# Patient Record
Sex: Female | Born: 1935 | Race: White | Hispanic: No | Marital: Married | Smoking: Former smoker
Health system: Southern US, Community
[De-identification: ages and names within clinical notes are randomized; demographics above are authoritative.]

## PROBLEM LIST (undated history)

## (undated) DIAGNOSIS — E079 Disorder of thyroid, unspecified: Secondary | ICD-10-CM

## (undated) DIAGNOSIS — I10 Essential (primary) hypertension: Secondary | ICD-10-CM

## (undated) DIAGNOSIS — F419 Anxiety disorder, unspecified: Secondary | ICD-10-CM

## (undated) DIAGNOSIS — F329 Major depressive disorder, single episode, unspecified: Secondary | ICD-10-CM

## (undated) DIAGNOSIS — I5189 Other ill-defined heart diseases: Secondary | ICD-10-CM

## (undated) DIAGNOSIS — I701 Atherosclerosis of renal artery: Secondary | ICD-10-CM

## (undated) DIAGNOSIS — E785 Hyperlipidemia, unspecified: Secondary | ICD-10-CM

## (undated) DIAGNOSIS — J9610 Chronic respiratory failure, unspecified whether with hypoxia or hypercapnia: Secondary | ICD-10-CM

## (undated) DIAGNOSIS — I150 Renovascular hypertension: Secondary | ICD-10-CM

## (undated) DIAGNOSIS — J449 Chronic obstructive pulmonary disease, unspecified: Secondary | ICD-10-CM

## (undated) DIAGNOSIS — N289 Disorder of kidney and ureter, unspecified: Secondary | ICD-10-CM

## (undated) DIAGNOSIS — M1990 Unspecified osteoarthritis, unspecified site: Secondary | ICD-10-CM

## (undated) DIAGNOSIS — F32A Depression, unspecified: Secondary | ICD-10-CM

## (undated) HISTORY — PX: BREAST SURGERY: SHX581

## (undated) HISTORY — DX: Renovascular hypertension: I15.0

## (undated) HISTORY — PX: APPENDECTOMY: SHX54

## (undated) HISTORY — PX: EYE SURGERY: SHX253

## (undated) HISTORY — DX: Essential (primary) hypertension: I10

## (undated) HISTORY — DX: Chronic obstructive pulmonary disease, unspecified: J44.9

## (undated) HISTORY — DX: Atherosclerosis of renal artery: I70.1

## (undated) HISTORY — DX: Unspecified osteoarthritis, unspecified site: M19.90

## (undated) HISTORY — DX: Anxiety disorder, unspecified: F41.9

## (undated) HISTORY — DX: Disorder of thyroid, unspecified: E07.9

## (undated) HISTORY — DX: Major depressive disorder, single episode, unspecified: F32.9

## (undated) HISTORY — DX: Hyperlipidemia, unspecified: E78.5

## (undated) HISTORY — DX: Depression, unspecified: F32.A

---

## 2002-07-08 ENCOUNTER — Ambulatory Visit (HOSPITAL_COMMUNITY): Admission: RE | Admit: 2002-07-08 | Discharge: 2002-07-09 | Payer: Self-pay | Admitting: *Deleted

## 2004-10-27 ENCOUNTER — Ambulatory Visit: Payer: Self-pay | Admitting: Internal Medicine

## 2005-05-14 ENCOUNTER — Ambulatory Visit: Payer: Self-pay

## 2005-09-04 ENCOUNTER — Ambulatory Visit: Payer: Self-pay

## 2005-09-10 ENCOUNTER — Ambulatory Visit: Payer: Self-pay

## 2005-09-17 ENCOUNTER — Ambulatory Visit: Payer: Self-pay

## 2005-09-19 ENCOUNTER — Ambulatory Visit: Payer: Self-pay

## 2005-10-15 ENCOUNTER — Ambulatory Visit: Payer: Self-pay | Admitting: General Surgery

## 2006-06-11 ENCOUNTER — Ambulatory Visit: Payer: Self-pay | Admitting: Internal Medicine

## 2007-01-28 ENCOUNTER — Ambulatory Visit: Payer: Self-pay | Admitting: Internal Medicine

## 2007-06-17 ENCOUNTER — Ambulatory Visit: Payer: Self-pay | Admitting: Internal Medicine

## 2007-08-22 ENCOUNTER — Ambulatory Visit: Payer: Self-pay | Admitting: Internal Medicine

## 2008-04-15 ENCOUNTER — Ambulatory Visit: Payer: Self-pay | Admitting: Internal Medicine

## 2008-04-27 ENCOUNTER — Other Ambulatory Visit: Payer: Self-pay

## 2008-04-28 ENCOUNTER — Inpatient Hospital Stay: Payer: Self-pay | Admitting: Vascular Surgery

## 2008-05-10 ENCOUNTER — Ambulatory Visit: Payer: Self-pay | Admitting: Internal Medicine

## 2008-06-01 ENCOUNTER — Ambulatory Visit: Payer: Self-pay | Admitting: Internal Medicine

## 2008-06-09 ENCOUNTER — Ambulatory Visit: Payer: Self-pay | Admitting: Gastroenterology

## 2008-06-10 ENCOUNTER — Ambulatory Visit: Payer: Self-pay | Admitting: Gastroenterology

## 2008-06-16 ENCOUNTER — Ambulatory Visit: Payer: Self-pay | Admitting: Internal Medicine

## 2008-07-07 ENCOUNTER — Ambulatory Visit: Payer: Self-pay | Admitting: Internal Medicine

## 2008-08-12 ENCOUNTER — Ambulatory Visit: Payer: Self-pay | Admitting: Internal Medicine

## 2008-11-18 ENCOUNTER — Emergency Department: Payer: Self-pay | Admitting: Emergency Medicine

## 2008-11-30 ENCOUNTER — Ambulatory Visit: Payer: Self-pay | Admitting: Internal Medicine

## 2008-12-21 ENCOUNTER — Ambulatory Visit: Payer: Self-pay | Admitting: Internal Medicine

## 2009-01-29 ENCOUNTER — Ambulatory Visit: Payer: Self-pay | Admitting: Internal Medicine

## 2009-01-30 ENCOUNTER — Emergency Department: Payer: Self-pay | Admitting: Emergency Medicine

## 2009-02-12 ENCOUNTER — Emergency Department: Payer: Self-pay | Admitting: Unknown Physician Specialty

## 2009-02-18 ENCOUNTER — Ambulatory Visit: Payer: Self-pay | Admitting: Specialist

## 2009-02-23 ENCOUNTER — Ambulatory Visit: Payer: Self-pay | Admitting: Urology

## 2009-02-24 ENCOUNTER — Ambulatory Visit: Payer: Self-pay | Admitting: Urology

## 2009-05-03 ENCOUNTER — Ambulatory Visit: Payer: Self-pay | Admitting: Urology

## 2009-05-05 ENCOUNTER — Ambulatory Visit: Payer: Self-pay | Admitting: Urology

## 2009-07-14 ENCOUNTER — Ambulatory Visit: Payer: Self-pay | Admitting: Internal Medicine

## 2009-12-15 ENCOUNTER — Ambulatory Visit: Payer: Self-pay | Admitting: Internal Medicine

## 2010-05-30 IMAGING — US US RENAL KIDNEY
1 series · 17 of 25 positions shown · non-contrast
Comparison: none

REASON FOR EXAM: pain
COMMENTS:

PROCEDURE:     US  - US KIDNEY  - February 12, 2009  [DATE]
RESULT:     Comparison: None
INDICATION: Pain

[Series 1: us renal kidney · 17 of 38 slices shown]
[im 1/38]
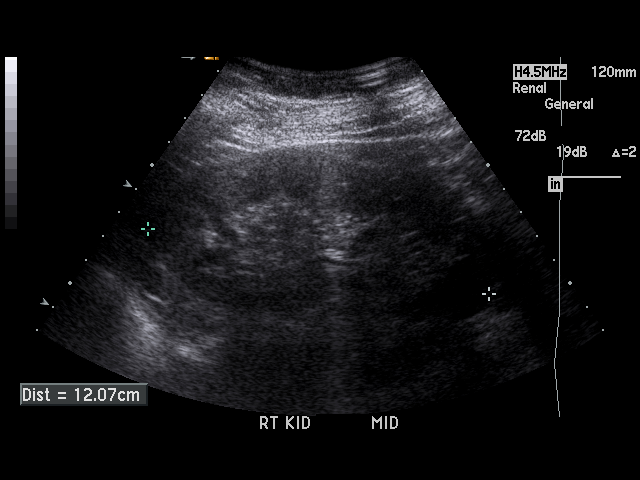
[im 4/38]
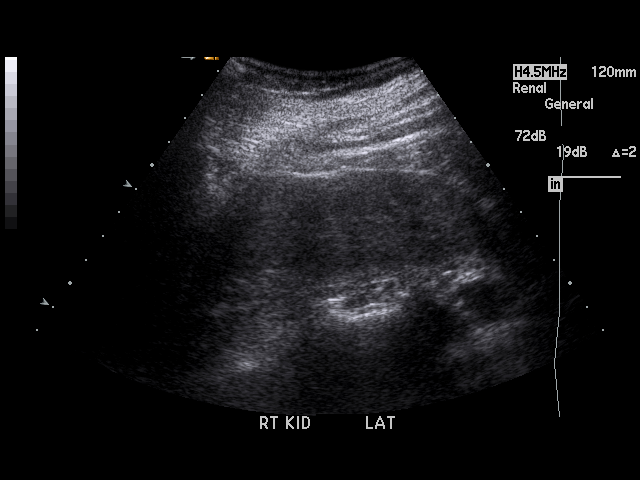
[im 5/38]
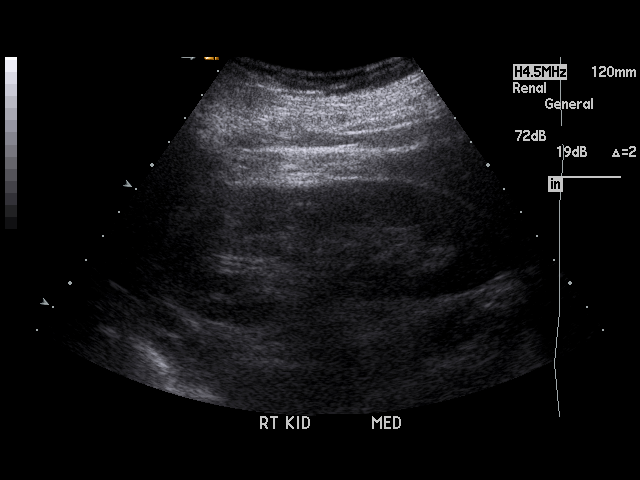
[im 8/38]
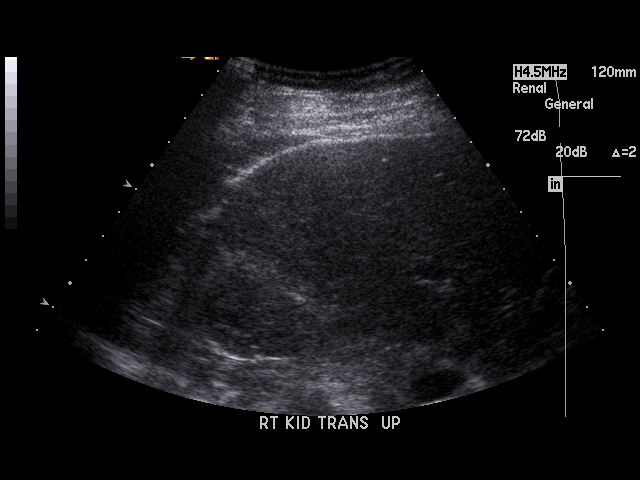
[im 10/38]
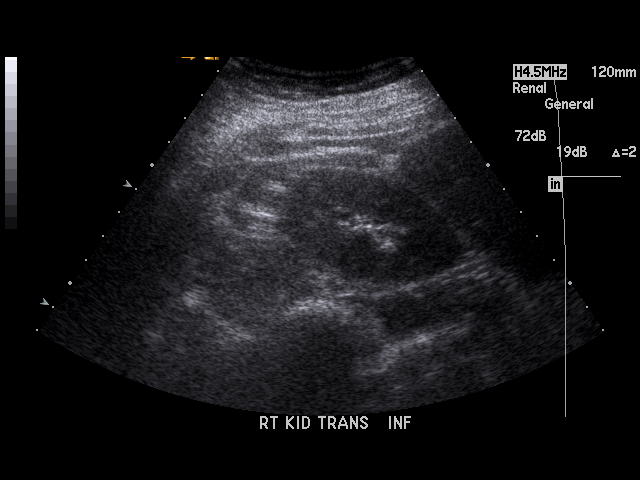
[im 13/38]
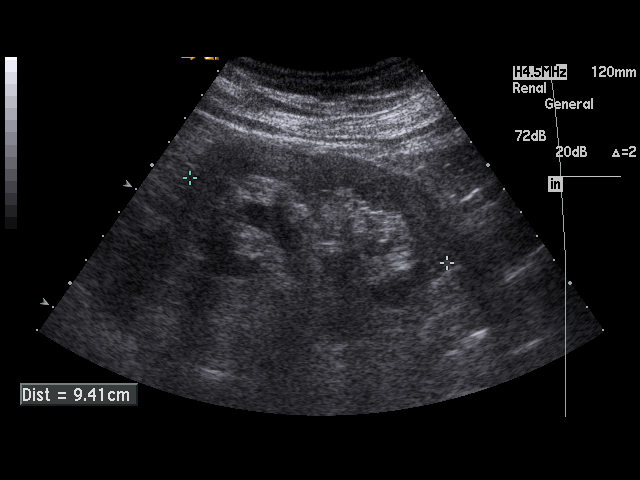
[im 14/38]
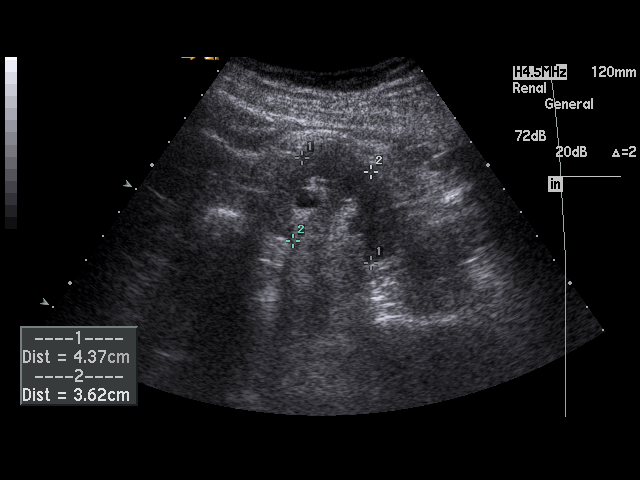
[im 17/38]
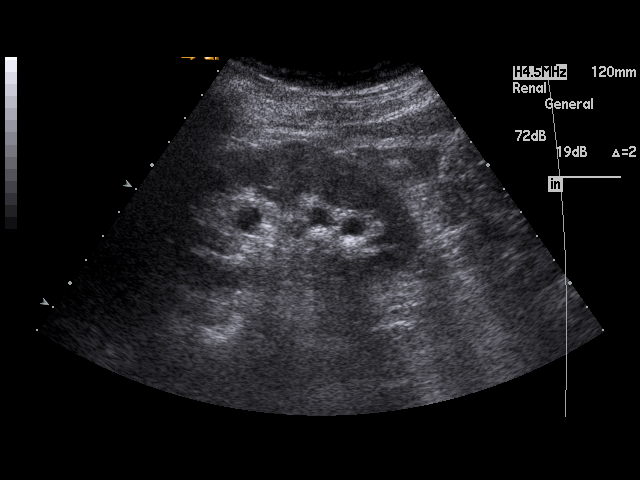
[im 19/38]
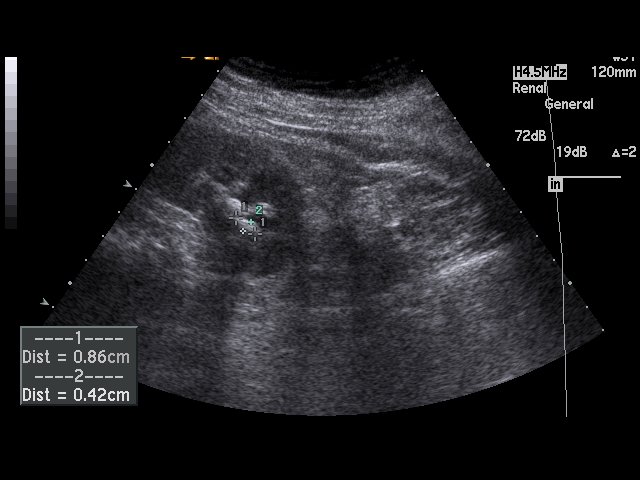
[im 21/38]
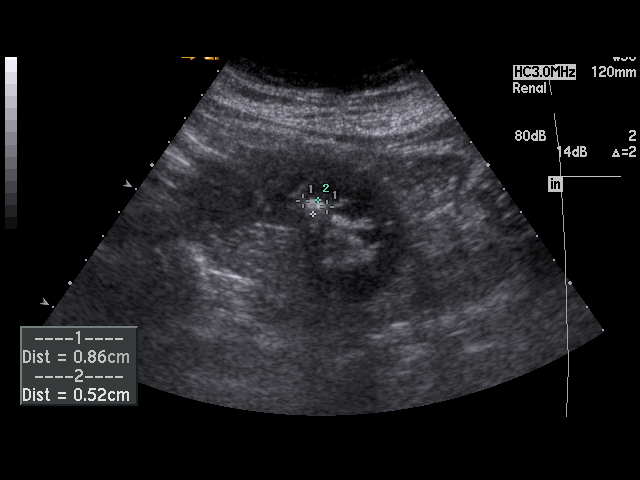
[im 24/38]
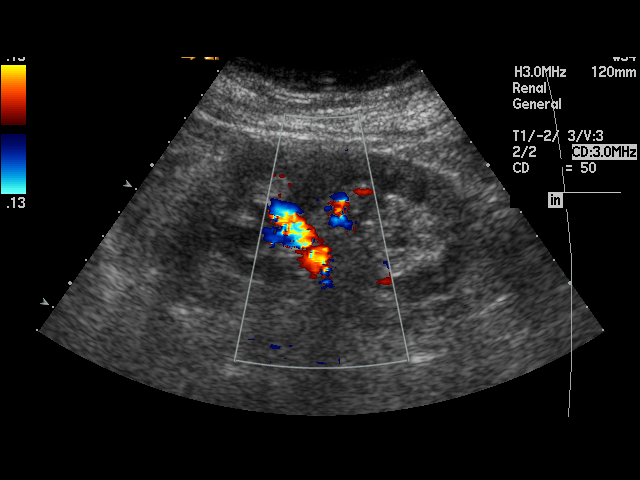
[im 25/38]
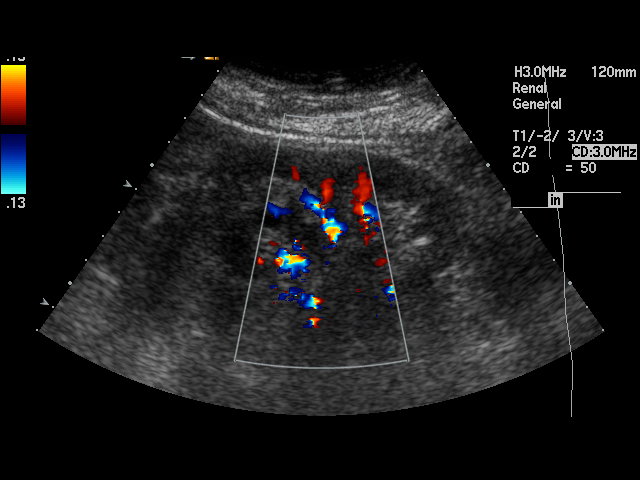
[im 28/38]
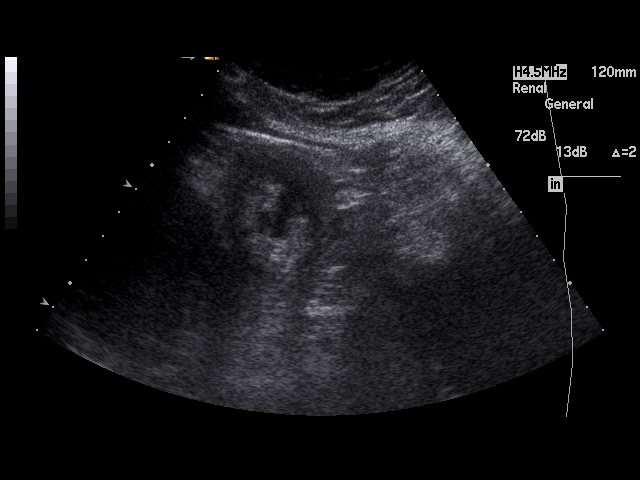
[im 30/38]
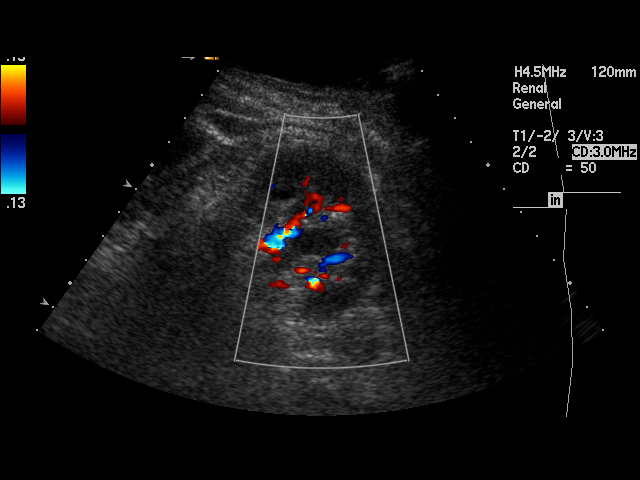
[im 33/38]
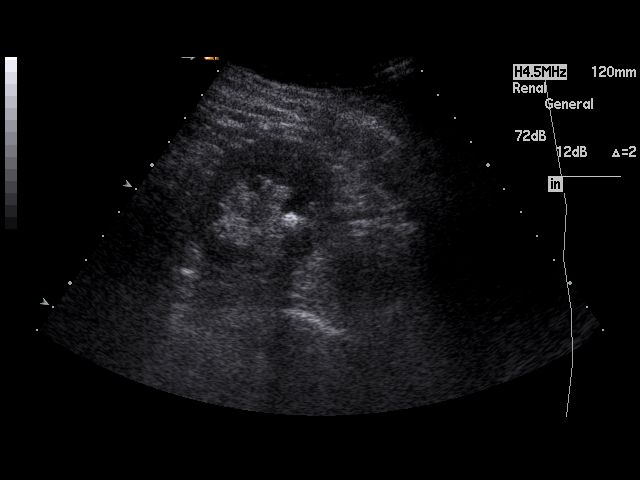
[im 34/38]
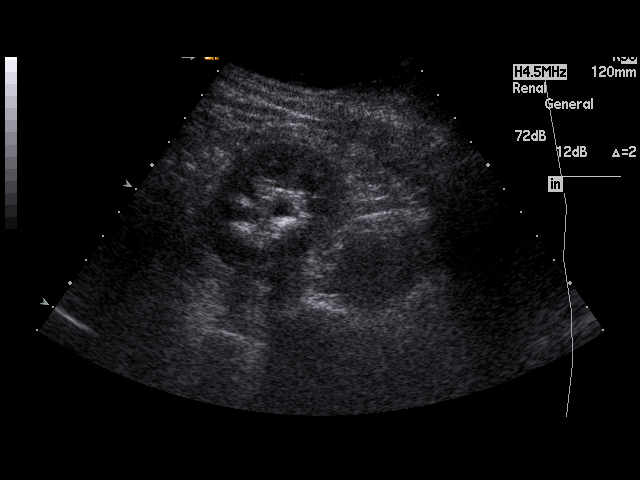
[im 38/38]
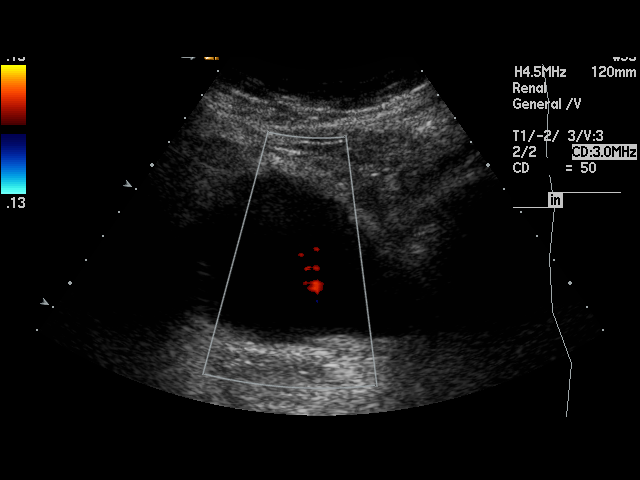

[17 of 25 positions shown; findings below may reference images not displayed]

Technique and findings: Multiple gray-scale and color doppler images of the
kidneys were obtained.

The right kidney measures 12.1 cm, the left kidney measures 9.5 cm. The
kidneys are normal in echogenicity. There is no hydronephrosis or evident
renal masses. There are 2 echogenic foci with posterior shadowing in the
left kidney most consistent with renal calculi. The echogenic focus in the
lower pole measures 1.9 cm in greatest dimension. The echogenic focus in the
midpole of the left kidney measures 9 mm in greatest dimension. No free
fluid in the region of the renal fossa.
IMPRESSION: Two nonobstructing left renal calculi again noted. There is no obstructive
uropathy.

## 2010-05-30 IMAGING — CR DG CHEST 2V
1 series · 2 of 2 positions shown · non-contrast
Comparison: none

REASON FOR EXAM: left flank pain
COMMENTS:

PROCEDURE:     DXR - DXR CHEST PA (OR AP) AND LATERAL  - February 12, 2009  [DATE]
RESULT:     Comparison: 12/21/2008

[Series 1: view not recorded · 0.17mm/px · 2 of 2 slices shown]
[im 1/2]
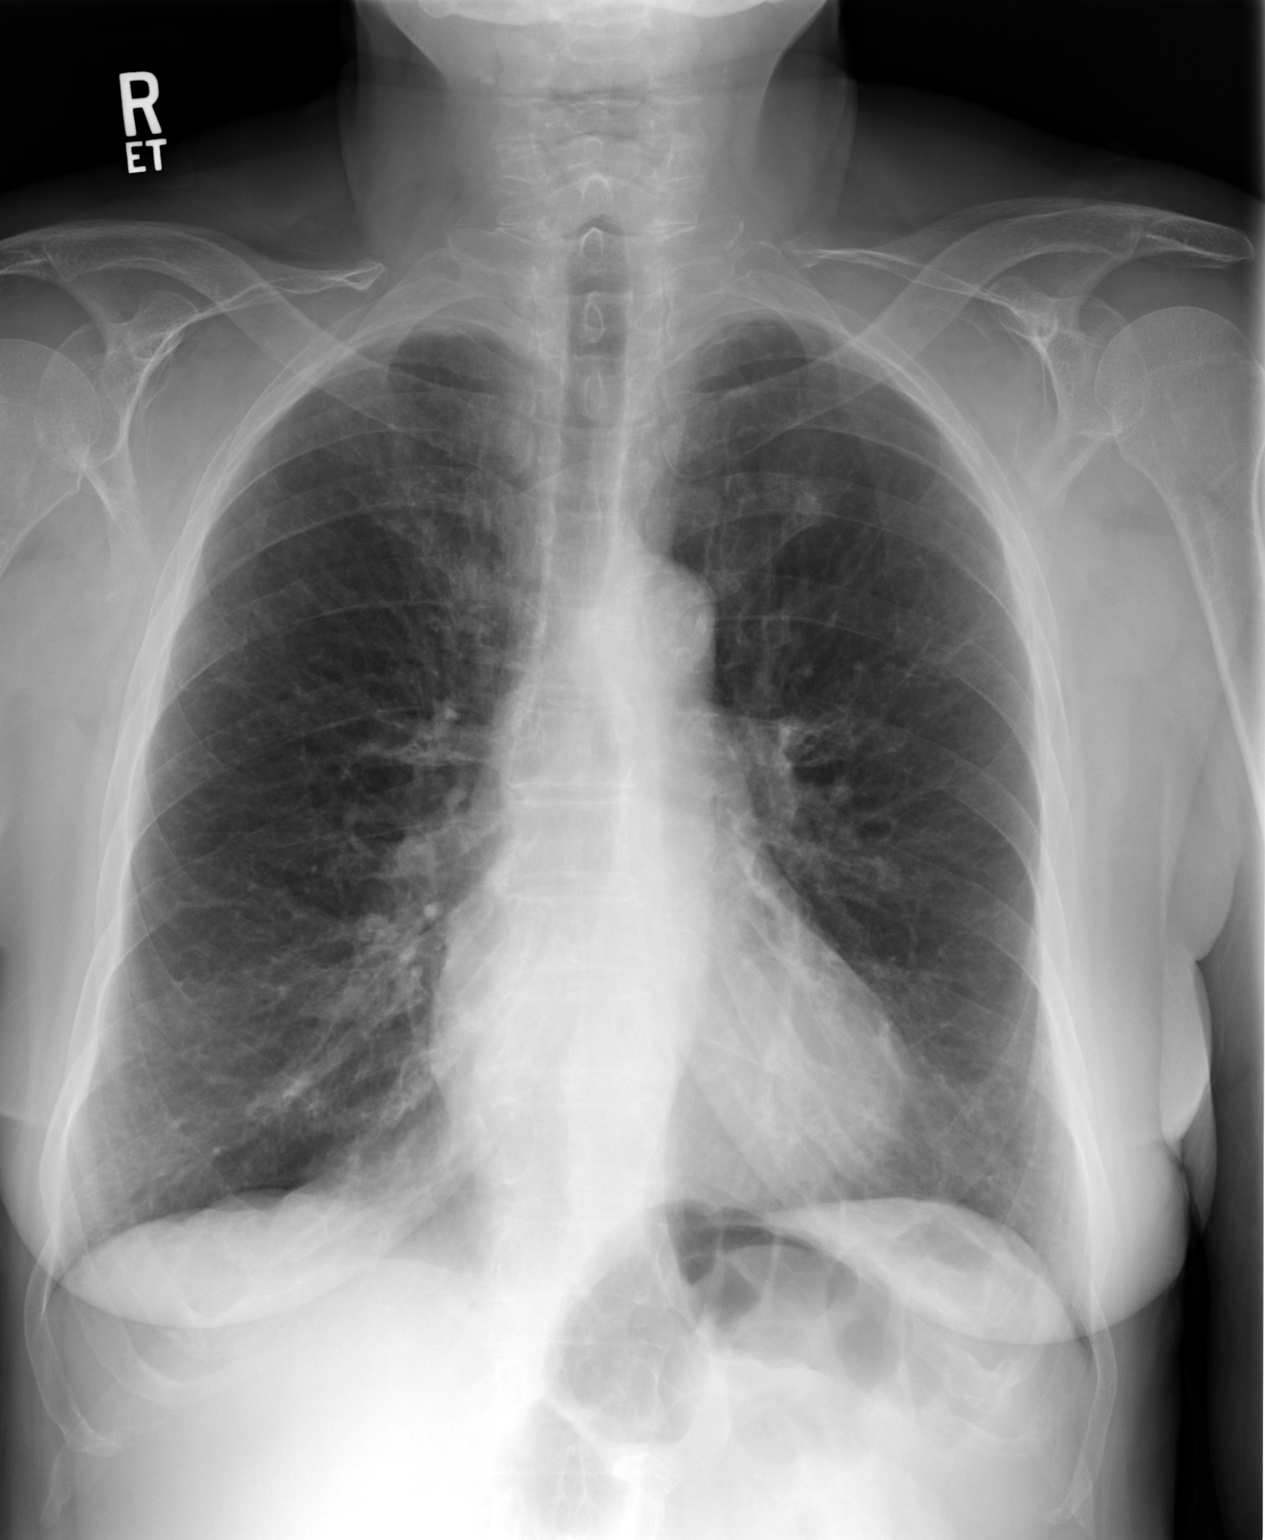
[im 2/2]
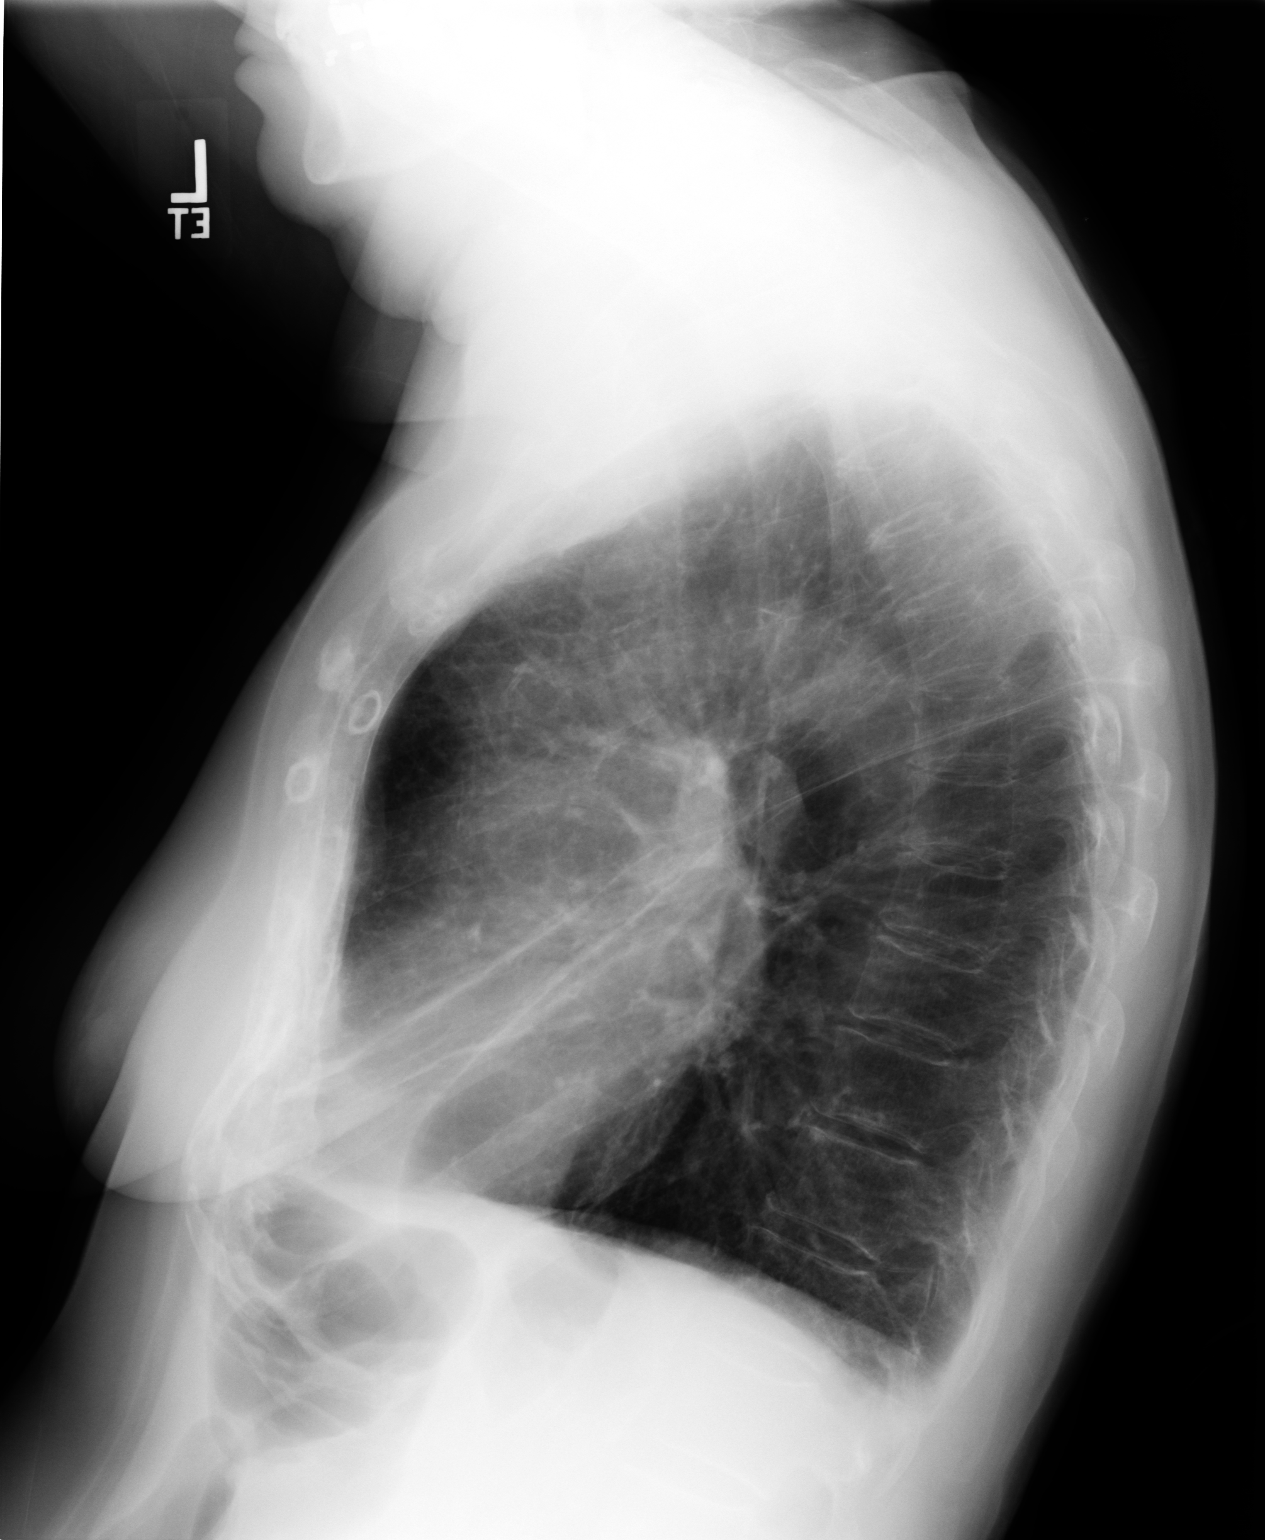

[2 of 2 positions shown; findings below may reference images not displayed]

FINDINGS: PA and lateral chest radiographs are provided. There is no focal parenchymal
opacity, pleural effusion, or pneumothorax. The lungs are hyperinflated with
relative lucency of the upper lung fields most consistent with COPD. The
heart and mediastinum are unremarkable. The osseous structures are
unremarkable.
IMPRESSION: No acute disease of the chest.

## 2010-06-11 IMAGING — CR DG ABDOMEN 1V
1 series · 2 of 2 positions shown · non-contrast
Comparison: none

REASON FOR EXAM: renal calculi-lithotripsy
COMMENTS:

[Series 1: view not recorded · 0.17mm/px · 2 of 2 slices shown]
[im 1/2]
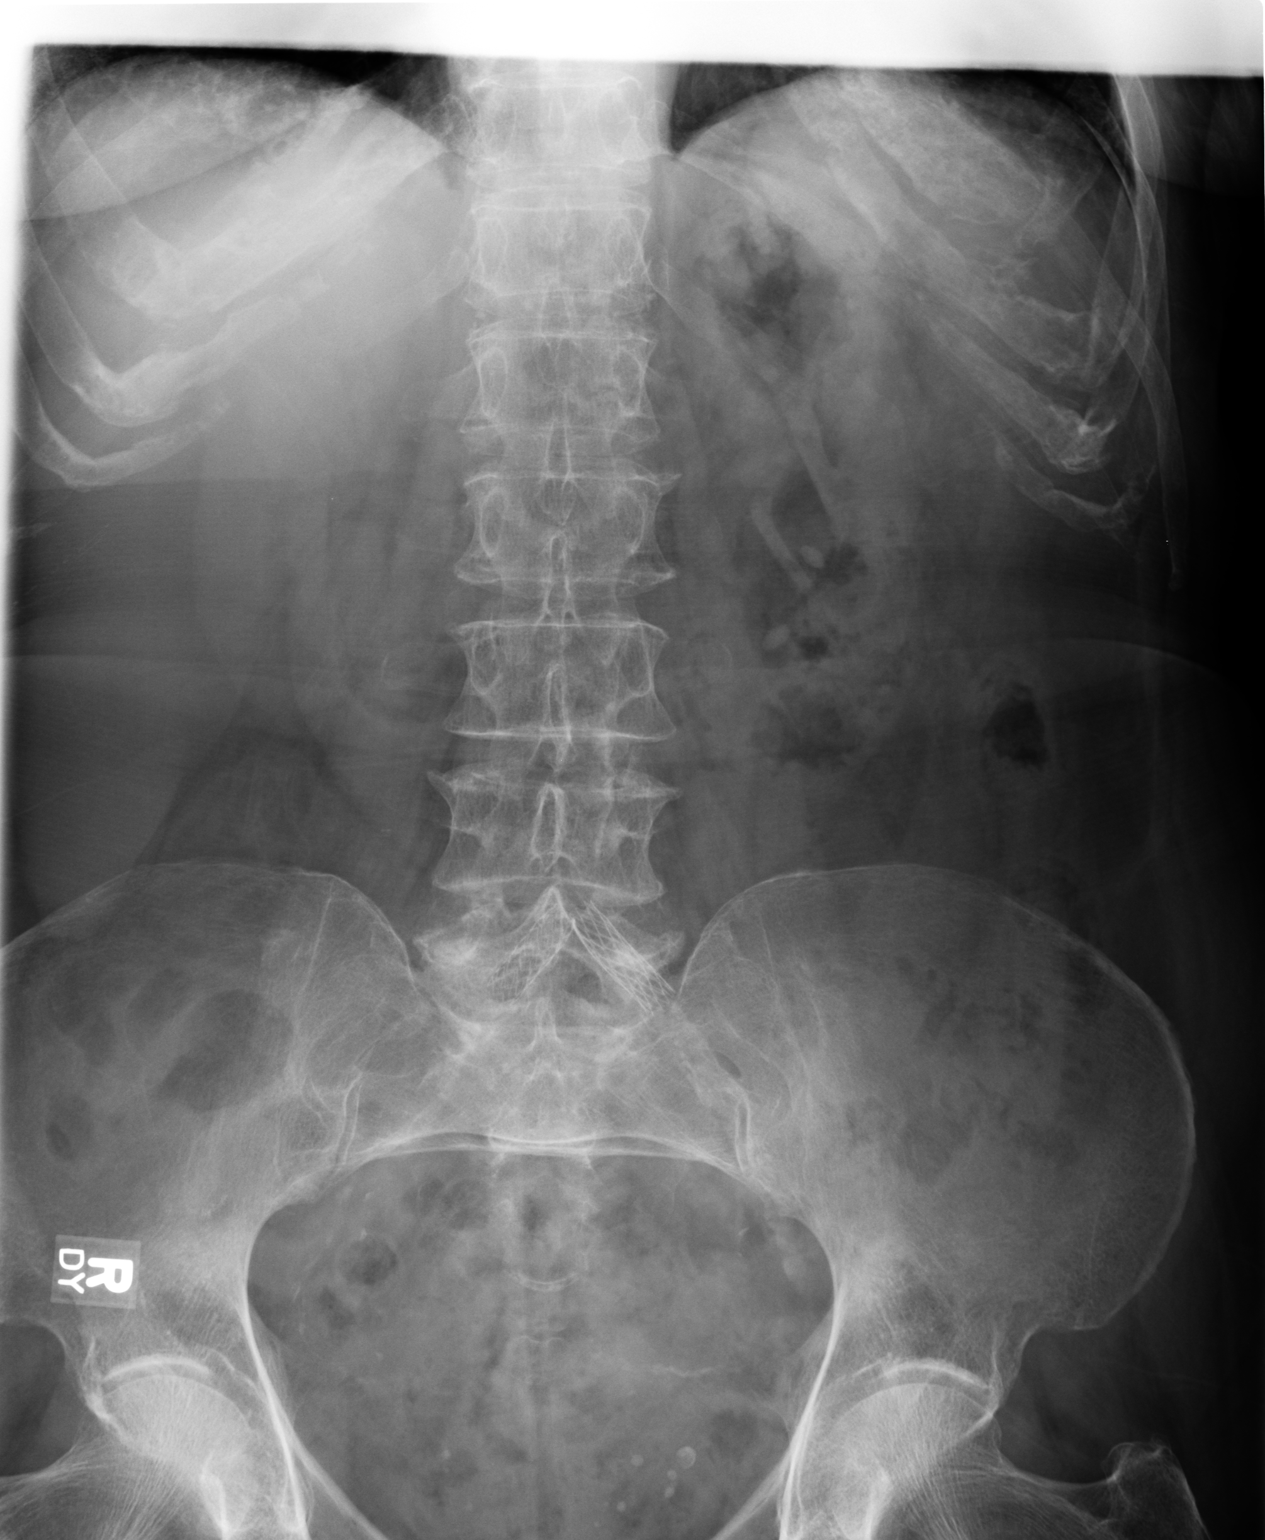
[im 2/2]
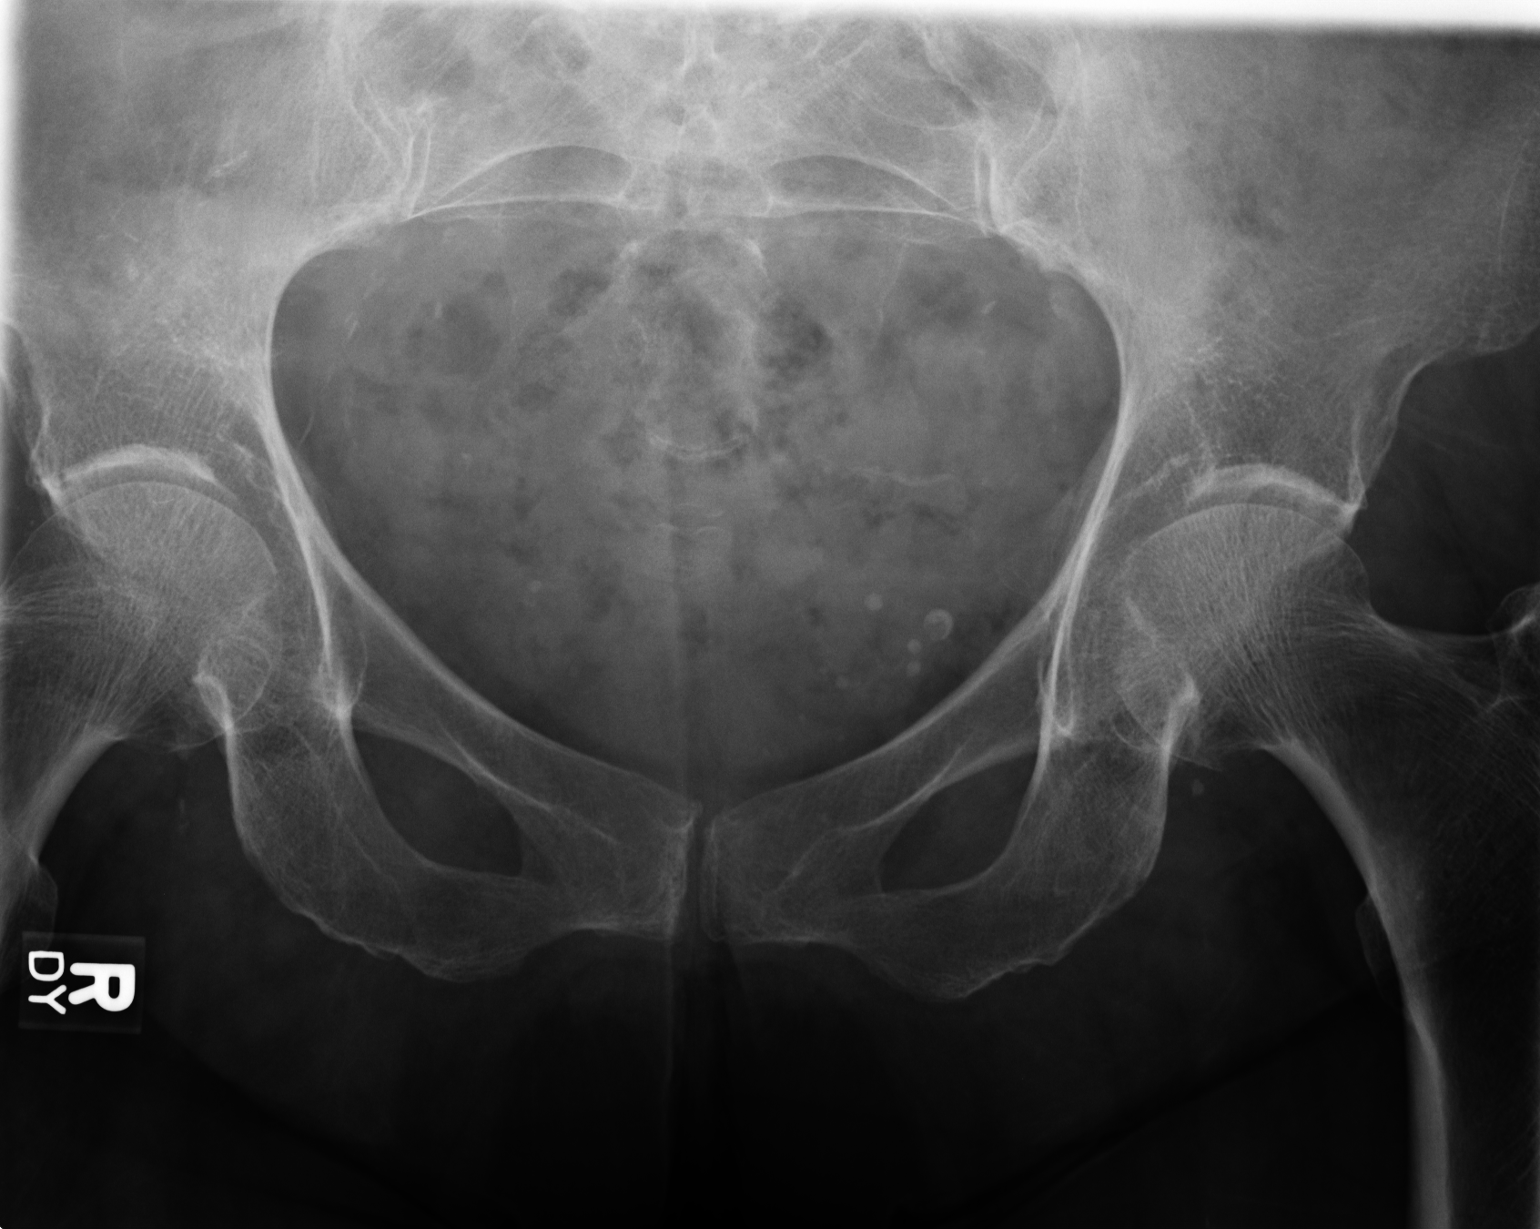

[2 of 2 positions shown; findings below may reference images not displayed]

PROCEDURE:     DXR - DXR KIDNEY URETER BLADDER  - February 24, 2009  [DATE]

RESULT:     There is no previous plain film for comparison. The patient has
undergone recent CT. There are smoothly marginated calcific densities
projecting over the region of the left renal pelvis and mid pole region. No
definite right renal calculi are seen. Multiple phleboliths appear to be
present in the pelvis. The bowel gas pattern is unremarkable.
IMPRESSION: Left nephrolithiasis.

## 2010-08-15 ENCOUNTER — Inpatient Hospital Stay: Payer: Self-pay | Admitting: Internal Medicine

## 2010-09-19 ENCOUNTER — Ambulatory Visit: Payer: Self-pay | Admitting: Internal Medicine

## 2011-03-15 ENCOUNTER — Ambulatory Visit: Payer: Self-pay | Admitting: Internal Medicine

## 2011-11-15 ENCOUNTER — Ambulatory Visit: Payer: Self-pay | Admitting: Internal Medicine

## 2012-02-11 ENCOUNTER — Ambulatory Visit: Payer: Self-pay | Admitting: Internal Medicine

## 2012-02-11 LAB — CREATININE, SERUM
Creatinine: 1.18 mg/dL (ref 0.60–1.30)
EGFR (African American): 57 — ABNORMAL LOW
EGFR (Non-African Amer.): 47 — ABNORMAL LOW

## 2012-03-03 ENCOUNTER — Inpatient Hospital Stay: Payer: Self-pay | Admitting: Specialist

## 2012-03-03 LAB — URINALYSIS, COMPLETE
Bilirubin,UR: NEGATIVE
Glucose,UR: 50 mg/dL (ref 0–75)
Hyaline Cast: 4
Ketone: NEGATIVE
Leukocyte Esterase: NEGATIVE
Ph: 7 (ref 4.5–8.0)
Protein: NEGATIVE
RBC,UR: 1 /HPF (ref 0–5)
WBC UR: <1 /HPF (ref 0–5)

## 2012-03-03 LAB — CK TOTAL AND CKMB (NOT AT ARMC)
CK, Total: 27 U/L (ref 21–215)
CK, Total: 37 U/L (ref 21–215)
CK-MB: 1.2 ng/mL (ref 0.5–3.6)

## 2012-03-03 LAB — CBC WITH DIFFERENTIAL/PLATELET
Basophil %: 0 %
Eosinophil #: 0 10*3/uL (ref 0.0–0.7)
Eosinophil %: 0.2 %
HCT: 39.6 % (ref 35.0–47.0)
Lymphocyte #: 0.3 10*3/uL — ABNORMAL LOW (ref 1.0–3.6)
Lymphocyte %: 2.9 %
MCH: 30.6 pg (ref 26.0–34.0)
MCHC: 33 g/dL (ref 32.0–36.0)
MCV: 93 fL (ref 80–100)
Monocyte %: 0.5 %
Neutrophil #: 9.9 10*3/uL — ABNORMAL HIGH (ref 1.4–6.5)
RBC: 4.28 10*6/uL (ref 3.80–5.20)
RDW: 16.1 % — ABNORMAL HIGH (ref 11.5–14.5)

## 2012-03-03 LAB — COMPREHENSIVE METABOLIC PANEL
Albumin: 2.9 g/dL — ABNORMAL LOW (ref 3.4–5.0)
Bilirubin,Total: 0.3 mg/dL (ref 0.2–1.0)
Calcium, Total: 8.9 mg/dL (ref 8.5–10.1)
Chloride: 96 mmol/L — ABNORMAL LOW (ref 98–107)
Creatinine: 1.62 mg/dL — ABNORMAL HIGH (ref 0.60–1.30)
EGFR (African American): 39 — ABNORMAL LOW
EGFR (Non-African Amer.): 32 — ABNORMAL LOW
Glucose: 346 mg/dL — ABNORMAL HIGH (ref 65–99)
SGPT (ALT): 29 U/L

## 2012-03-03 LAB — PRO B NATRIURETIC PEPTIDE: B-Type Natriuretic Peptide: 479 pg/mL — ABNORMAL HIGH (ref 0–450)

## 2012-03-03 LAB — MAGNESIUM: Magnesium: 1.6 mg/dL — ABNORMAL LOW

## 2012-03-03 LAB — TROPONIN I
Troponin-I: 0.03 ng/mL
Troponin-I: 0.02 ng/mL

## 2012-03-04 LAB — TROPONIN I: Troponin-I: 0.02 ng/mL

## 2012-03-04 LAB — MAGNESIUM: Magnesium: 1.8 mg/dL

## 2012-03-04 LAB — CBC WITH DIFFERENTIAL/PLATELET
Basophil #: 0 10*3/uL (ref 0.0–0.1)
Lymphocyte #: 0.4 10*3/uL — ABNORMAL LOW (ref 1.0–3.6)
Lymphocyte %: 4.6 %
MCHC: 33.9 g/dL (ref 32.0–36.0)
Monocyte #: 0.1 10*3/uL (ref 0.0–0.7)
Neutrophil #: 7.5 10*3/uL — ABNORMAL HIGH (ref 1.4–6.5)
RBC: 4.23 10*6/uL (ref 3.80–5.20)
RDW: 15.9 % — ABNORMAL HIGH (ref 11.5–14.5)
WBC: 7.9 10*3/uL (ref 3.6–11.0)

## 2012-03-04 LAB — BASIC METABOLIC PANEL
Anion Gap: 10 (ref 7–16)
Chloride: 100 mmol/L (ref 98–107)
Co2: 27 mmol/L (ref 21–32)
Creatinine: 1.21 mg/dL (ref 0.60–1.30)
EGFR (African American): 55 — ABNORMAL LOW
Osmolality: 287 (ref 275–301)
Sodium: 137 mmol/L (ref 136–145)

## 2012-03-04 LAB — CK TOTAL AND CKMB (NOT AT ARMC): CK, Total: 28 U/L (ref 21–215)

## 2012-03-06 LAB — TSH: Thyroid Stimulating Horm: 0.294 u[IU]/mL — ABNORMAL LOW

## 2012-03-06 LAB — HEMOGLOBIN: HGB: 12.7 g/dL (ref 12.0–16.0)

## 2012-03-08 LAB — CBC WITH DIFFERENTIAL/PLATELET
Eosinophil #: 0 10*3/uL (ref 0.0–0.7)
Eosinophil %: 0 %
HCT: 35.7 % (ref 35.0–47.0)
HGB: 12.1 g/dL (ref 12.0–16.0)
Lymphocyte #: 0.2 10*3/uL — ABNORMAL LOW (ref 1.0–3.6)
Lymphocyte %: 1.8 %
MCHC: 34 g/dL (ref 32.0–36.0)
MCV: 91 fL (ref 80–100)
Monocyte #: 0.2 x10 3/mm (ref 0.2–0.9)
RBC: 3.94 10*6/uL (ref 3.80–5.20)
RDW: 15.4 % — ABNORMAL HIGH (ref 11.5–14.5)

## 2012-03-08 LAB — BASIC METABOLIC PANEL
BUN: 30 mg/dL — ABNORMAL HIGH (ref 7–18)
Calcium, Total: 8.4 mg/dL — ABNORMAL LOW (ref 8.5–10.1)
Creatinine: 1.13 mg/dL (ref 0.60–1.30)
EGFR (African American): 53 — ABNORMAL LOW
EGFR (Non-African Amer.): 46 — ABNORMAL LOW
Glucose: 217 mg/dL — ABNORMAL HIGH (ref 65–99)
Osmolality: 290 (ref 275–301)
Potassium: 3.8 mmol/L (ref 3.5–5.1)

## 2012-03-09 LAB — CULTURE, BLOOD (SINGLE)

## 2012-03-10 LAB — EXPECTORATED SPUTUM ASSESSMENT W GRAM STAIN, RFLX TO RESP C

## 2012-04-01 ENCOUNTER — Ambulatory Visit: Payer: Self-pay | Admitting: Specialist

## 2012-06-04 ENCOUNTER — Ambulatory Visit: Payer: Self-pay | Admitting: Internal Medicine

## 2012-06-04 LAB — CBC CANCER CENTER
Lymphocytes: 23 %
MCV: 82 fL (ref 80–100)
Monocytes: 5 %
Platelet: 320 x10 3/mm (ref 150–440)
RBC: 4.23 10*6/uL (ref 3.80–5.20)
Segmented Neutrophils: 66 %
WBC: 7.5 x10 3/mm (ref 3.6–11.0)

## 2012-06-04 LAB — LACTATE DEHYDROGENASE: LDH: 174 U/L (ref 84–246)

## 2012-06-04 LAB — FOLATE: Folic Acid: 15.8 ng/mL (ref 3.1–100.0)

## 2012-06-04 LAB — IRON AND TIBC: Unbound Iron-Bind.Cap.: 291 ug/dL

## 2012-06-05 LAB — URINE IEP, RANDOM

## 2012-06-06 LAB — PROT IMMUNOELECTROPHORES(ARMC)

## 2012-06-26 ENCOUNTER — Ambulatory Visit: Payer: Self-pay | Admitting: Internal Medicine

## 2012-09-08 ENCOUNTER — Ambulatory Visit: Payer: Self-pay | Admitting: Internal Medicine

## 2012-09-19 ENCOUNTER — Ambulatory Visit: Payer: Self-pay | Admitting: Internal Medicine

## 2012-09-23 ENCOUNTER — Ambulatory Visit: Payer: Self-pay | Admitting: Vascular Surgery

## 2013-02-25 ENCOUNTER — Ambulatory Visit: Payer: Self-pay | Admitting: Internal Medicine

## 2013-06-30 ENCOUNTER — Encounter: Payer: Self-pay | Admitting: Vascular Surgery

## 2013-07-08 ENCOUNTER — Encounter: Payer: Self-pay | Admitting: Vascular Surgery

## 2013-07-09 ENCOUNTER — Ambulatory Visit (INDEPENDENT_AMBULATORY_CARE_PROVIDER_SITE_OTHER): Payer: Medicare Other | Admitting: Vascular Surgery

## 2013-07-09 ENCOUNTER — Encounter: Payer: Self-pay | Admitting: Vascular Surgery

## 2013-07-09 VITALS — BP 191/60 | HR 87 | Resp 22 | Ht 63.0 in | Wt 129.0 lb

## 2013-07-09 DIAGNOSIS — I701 Atherosclerosis of renal artery: Secondary | ICD-10-CM | POA: Insufficient documentation

## 2013-07-09 DIAGNOSIS — J449 Chronic obstructive pulmonary disease, unspecified: Secondary | ICD-10-CM

## 2013-07-09 DIAGNOSIS — I779 Disorder of arteries and arterioles, unspecified: Secondary | ICD-10-CM | POA: Insufficient documentation

## 2013-07-09 DIAGNOSIS — I10 Essential (primary) hypertension: Secondary | ICD-10-CM | POA: Insufficient documentation

## 2013-07-09 DIAGNOSIS — I743 Embolism and thrombosis of arteries of the lower extremities: Secondary | ICD-10-CM

## 2013-07-09 NOTE — Progress Notes (Signed)
[facility]  Referred by:  Lyndon Code, MD [address] [place], [place] [address]  Reason for referral: renal artery stenosis  History of Present Illness  Brittany Booth is a 79 y.o. (08-21-1928) female who presents with chief complaint: possible kidney issue.  By report this patient had a renal artery duplex which demonstrated a >60% stenosis.  The patient's PCP is referring this patient for evaluation for renovascular hypertension and possible mgmt of such.  The patient currently is on a three drug regimen: Diltiazem, Lasix, and Hydralazine.  The continues to have poor control with this regimen.  She denies any deteriorating in renal function and her urinary pattern is unchanged.  Her atherosclerotic risk factors include:  HTN, hyperlipidemia, and prior smoking.  The pt notes also a history of stent placement (?possible iliac stenting).  She is being managed by Dr. Lorretta Harp for those stents.  Past Medical History  Diagnosis Date  . Anxiety   . COPD (chronic obstructive pulmonary disease)   . Depression   . Hypertension   . Hyperlipidemia   . Thyroid disease     Hypothyroidism  . Arthritis   . Renal artery stenosis   . Secondary renovascular hypertension, unspecified   . Mitral valve disorders   . Cardiomegaly   . Nonspecific abnormal results of kidney function study     Past Surgical History  Procedure Laterality Date  . Breast surgery    . Cholecystectomy    . Eye surgery      History   Social History  . Marital Status: Married    Spouse Name: N/A    Number of Children: N/A  . Years of Education: N/A   Occupational History  . Not on file.   Social History Main Topics  . Smoking status: Former Smoker    Types: Cigarettes    Quit date: 07/10/2003  . Smokeless tobacco: Never Used  . Alcohol Use: No  . Drug Use: No  . Sexual Activity: Not on file   Other Topics Concern  . Not on file   Social History Narrative  . No narrative on  file    Family History  Problem Relation Age of Onset  . Lupus Father   . Asthma Other   . Heart disease Other   . Hyperlipidemia Other     Current Outpatient Prescriptions on File Prior to Visit  Medication Sig Dispense Refill  . ALPRAZolam (XANAX) 0.25 MG tablet Take 0.25 mg by mouth at bedtime as needed for sleep.      . budesonide (PULMICORT) 0.5 MG/2ML nebulizer solution Take 0.5 mg by nebulization 2 (two) times daily.      Marland Kitchen diltiazem (CARDIZEM LA) 300 MG 24 hr tablet Take 300 mg by mouth daily.      . fexofenadine (ALLEGRA) 180 MG tablet Take 180 mg by mouth daily.      . furosemide (LASIX) 20 MG tablet Take 20 mg by mouth as needed.      . hydrALAZINE (APRESOLINE) 10 MG tablet Take 10 mg by mouth 3 (three) times daily.      . Ipratropium-Albuterol (COMBIVENT RESPIMAT) 20-100 MCG/ACT AERS respimat Inhale 1 puff into the lungs every 6 (six) hours.      Marland Kitchen levalbuterol (XOPENEX) 1.25 MG/3ML nebulizer solution Take 1.25 mg by nebulization every 4 (four) hours as needed for wheezing.      Marland Kitchen levothyroxine (SYNTHROID, LEVOTHROID) 112 MCG tablet Take 112 mcg by mouth daily before breakfast.      .  mirtazapine (REMERON) 15 MG tablet Take 15 mg by mouth at bedtime.      . montelukast (SINGULAIR) 10 MG tablet Take 10 mg by mouth at bedtime.      . roflumilast (DALIRESP) 500 MCG TABS tablet Take 500 mcg by mouth daily.      Marland Kitchen tiotropium (SPIRIVA) 18 MCG inhalation capsule Place 18 mcg into inhaler and inhale daily.      . traMADol (ULTRAM) 50 MG tablet Take 50 mg by mouth every 6 (six) hours as needed for pain.      Marland Kitchen zolpidem (AMBIEN) 10 MG tablet Take 10 mg by mouth at bedtime as needed for sleep.       No current facility-administered medications on file prior to visit.    Allergies  Allergen Reactions  . Latex   . Sulfa Antibiotics      REVIEW OF SYSTEMS:  (Positives checked otherwise negative)  CARDIOVASCULAR:  []  chest pain, []  chest pressure, []  palpitations, [x]  shortness  of breath when laying flat, [x]  shortness of breath with exertion,  []  pain in feet when walking, []  pain in feet when laying flat, []  history of blood clot in veins (DVT), []  history of phlebitis, []  swelling in legs, []  varicose veins  PULMONARY:  [x]  productive cough, [x]  asthma, [x]  wheezing  NEUROLOGIC:  [x]  weakness in arms or legs, []  numbness in arms or legs, []  difficulty speaking or slurred speech, []  temporary loss of vision in one eye, []  dizziness  HEMATOLOGIC:  []  bleeding problems, []  problems with blood clotting too easily  MUSCULOSKEL:  []  joint pain, []  joint swelling  GASTROINTEST:  []  vomiting blood, []  blood in stool     GENITOURINARY:  []  burning with urination, []  blood in urine  PSYCHIATRIC:  []  history of major depression  INTEGUMENTARY:  []  rashes, []  ulcers  CONSTITUTIONAL:  []  fever, []  chills  Physical Examination  Filed Vitals:   07/09/13 1503  BP: 191/60  Pulse: 87  Resp: 22  Height: 5\' 3"  (1.6 m)  Weight: 129 lb (58.514 kg)    Body mass index is 22.86 kg/(m^2).  General: A&O x 3, WD, thin, on nasal oxygen  Head: [place]/AT  Ear/Nose/Throat: Hearing grossly intact, nares w/o erythema or drainage, oropharynx w/o Erythema/Exudate, Mallampati score: 2  Eyes: PERRLA, EOMI  Neck: Supple, no nuchal rigidity, no palpable LAD  Pulmonary: Sym exp, faint BS, no rales, & wheezing, faint rales  Cardiac: RRR, Nl S1, S2, no Murmurs, rubs or gallops  Vascular: Vessel Right Left  Radial Palpable Palpable  Brachial Palpable Palpable  Carotid Palpable, without bruit Palpable, without bruit  Aorta Not palpable N/A  Femoral Palpable Palpable  Popliteal Not palpable Not palpable  PT Not Palpable Not Palpable  DP Faintly Palpable Faintly Palpable   Gastrointestinal: soft, NTND, -G/R, - HSM, - masses, - CVAT B  Musculoskeletal: M/S 5/5 throughout , Extremities without ischemic changes   Neurologic: CN grossly intact , Pain and light touch intact in  extremities , Motor exam as listed above  Psychiatric: Judgment intact, Mood & affect appropriate for pt's clinical situation  Dermatologic: See M/S exam for extremity exam, no rashes otherwise noted  Lymph : No Cervical, Axillary, or Inguinal lymphadenopathy   Non-Invasive Vascular Imaging  Renal artery duplex (Date: 07/09/2013)  R: Length 11.7 cm , RI 0.84, RAR 1.73  L: Length: 8.7 cm, RI 0.59-0.8, RAR 1.1  Based on my review of this renal arterial duplex, it is incorrectly read.  A >  60% diagnosis requires a RAR > 3.5.  Outside Studies/Documentation 10 pages of outside documents were reviewed including: outpatient chart.  Medical Decision Making  CHERELL COLVIN is a 79 y.o. female who presents with: poorly controlled hypertension, oxygen dependent COPD, history of possible iliac stents, no evidence of renal artery stenosis.   Unfortunately, the outside facility renal artery duplex is incorrectly read, as the RAR < 3.5 on both sides, so the proper interpretation is no evidence of renal artery stenosis.  In general, the renal artery duplex has to be done by a technician with experience and expertise due to its difficulty.    The gold standard test, renal angiogram, involves nephrotoxic contrast, so I don't usually recommend it unless I suspect the patient will benefit from intervention.  The problem is the recently published CORAL trial essential suggests there are NO patients that will benefit from renal stenting.    I'm not certain this to be true, but I don't think this patient meets criteria to consider renal artery stenting, so I don't think the renal angiogram is indicated.  I discussed in depth with the patient the nature of atherosclerosis, and emphasized the importance of maximal medical management including strict control of blood pressure, blood glucose, and lipid levels, antiplatelet agents, obtaining regular exercise, and cessation of smoking.    The patient is aware  that without maximal medical management the underlying atherosclerotic disease process will progress, limiting the benefit of any interventions.  The patient is going to continue mgmt of her ?iliac stent with Dr. Lorretta Harp.  Thank you for allowing [place] to participate in this patient's care.  Leonides Sake, MD [facility] Office: [phone] Pager: [phone]  07/09/2013, 3:54 PM

## 2013-09-28 ENCOUNTER — Ambulatory Visit: Payer: Self-pay | Admitting: Physician Assistant

## 2013-11-02 ENCOUNTER — Ambulatory Visit: Payer: Self-pay | Admitting: Physician Assistant

## 2014-02-03 ENCOUNTER — Ambulatory Visit: Payer: Self-pay | Admitting: Internal Medicine

## 2014-05-19 ENCOUNTER — Ambulatory Visit: Payer: Self-pay

## 2014-06-10 ENCOUNTER — Ambulatory Visit: Payer: Self-pay | Admitting: Physician Assistant

## 2014-09-30 ENCOUNTER — Ambulatory Visit: Payer: Self-pay | Admitting: Internal Medicine

## 2014-10-05 ENCOUNTER — Ambulatory Visit: Payer: Self-pay | Admitting: Internal Medicine

## 2014-10-14 ENCOUNTER — Ambulatory Visit: Payer: Self-pay | Admitting: Physician Assistant

## 2015-01-24 ENCOUNTER — Ambulatory Visit: Payer: Self-pay | Admitting: Ophthalmology

## 2015-03-20 NOTE — Op Note (Signed)
PATIENT NAME:  Brittany Booth, Brittany Booth MR#:  [id number] DATE OF BIRTH:  Jan 14, 1928  DATE OF PROCEDURE:  03/10/2012  PREOPERATIVE DIAGNOSES:  1. Pseudomonas pneumonia requiring long-term IV antibiotics.  2. Chronic obstructive pulmonary disease.   POSTOPERATIVE DIAGNOSES:  1. Pseudomonas pneumonia requiring long-term IV antibiotics.  2. Chronic obstructive pulmonary disease.   PROCEDURES:  1. Ultrasound guidance for vascular access to right basilic vein.  2. Fluoroscopic guidance for placement of catheter.  3. Insertion of peripherally inserted central venous catheter, right arm.  SURGEON:   Festus BarrenJason Misbah Hornaday, MD  ANESTHESIA: Local.   ESTIMATED BLOOD LOSS: Minimal.   INDICATION FOR PROCEDURE:  This is an 81 year old female with a resistant pneumonia requiring long-term antibiotics.  PICC line is requested.  DESCRIPTION OF PROCEDURE: The patient's right arm was sterilely prepped and draped, and a sterile surgical field was created. The right basilic vein was accessed under direct ultrasound guidance without difficulty with a micropuncture needle and permanent image was recorded. 0.018 wire was then placed into the superior vena cava. Peel-away sheath was placed over the wire. A single lumen peripherally inserted central venous catheter was then placed over the wire and the wire and peel-away sheath were removed. The catheter tip was placed into the superior vena cava and was secured at the skin at 40 cm with a sterile dressing. The catheter withdrew blood well and flushed easily with heparinized saline.      The patient tolerated procedure well.   ____________________________ Annice NeedyJason S. Shawnte Winton, MD jsd:bjt D: 03/10/2012 12:29:23 ET T: 03/10/2012 16:08:46 ET JOB#: [id number]  cc: Annice NeedyJason S. Clovia Reine, MD, <Dictator> Annice NeedyJASON S Juda Toepfer MD ELECTRONICALLY SIGNED 03/17/2012 9:53

## 2015-03-20 NOTE — H&P (Signed)
PATIENT NAME:  Brittany Booth, Brittany Booth MR#:  [id number] DATE OF BIRTH:  26-Dec-1927  DATE OF ADMISSION:  03/03/2012  PRIMARY CARE PHYSICIAN: Dr. Beverely Risen  PULMONOLOGIST: Dr. Freda Munro   CHIEF COMPLAINT: Shortness of breath, cough.   HISTORY OF PRESENT ILLNESS: Patient is an 81 year old female with past medical history of chronic obstructive pulmonary disease, hypothyroidism, peripheral vascular disease, hypertension who presents as a direct admit from Dr. Sampson Goon office. Patient chronic obstructive pulmonary disease and was a smoker, she quit 5 to 6 years ago. She normally used oxygen only at night, however, has been using continue 24 hours a day oxygen 2 liters for the past one month. She initially presented to Dr. Sampson Goon office about a month ago with cough and shortness of breath. CAT scan of the chest done on 02/11/2012 showed left upper lobe interstitial density, possible interstitial pneumonitis. Patient was treated with steroids and antibiotics. She took Cipro for a few days, however, that made her feel dizzy therefore she was switched to Levaquin which she has been taking for the past seven days. She reports that since the Levaquin she has been severely constipated. She also noted that while cleaning her humidifier it was very dirty and that was felt to be one of the causes for her continued symptoms. A gram stain sputum was sent from Dr. Sampson Goon office and it grew Pseudomonas sensitive to Levaquin and essentially all other antibiotics. She came to Dr. Sampson Goon office today with increased work of breathing and increasing oxygen requirement. Her last PFTs showed that she had good FEV1 of 1.76 which was 97% predicted value for her condition. Patient reports that she has been very short of breath on minimal exertion, even while talking she becomes dyspneic. She has been very weak, has been having cough with mostly clear phlegm but occasionally she expectorates yellow phlegm. She denies  any fever. She has been having some left-sided chest pain. Denies any weight gain or leg swelling. She is constipated.   PAST MEDICAL HISTORY:  1. Chronic obstructive pulmonary disease. 2. Chronic respiratory failure on home oxygen 2 liters at night; for the past one month she has been using the oxygen 24/7. 3. Hypothyroidism. 4. Osteoporosis. 5. Peripheral vascular disease. 6. Hypertension. 7. Hyperlipidemia. 8. History of GI bleed. 9. Gastroesophageal reflux disease. 10. Kidney stones.  11. Osteoarthritis.   CURRENT MEDICATIONS:  1. Benicar/HCTZ 20/12.5 half tablet. 2. Cardizem 300 mg daily.  3. Prednisone 20 mg daily. 4. Spiriva daily.  5. Xopenex t.i.d.  6. Ambien 10 mg at bedtime.  7. Combivent 2 puffs b.i.d.  8. Advair 1 puff b.i.d.  9. Synthroid 100 mcg daily.  10. Theophylline 24 mg daily.  11. Lasix as needed.  12. Allegra 180 mg daily.  13. Vitamin D 1000 international units once a day.   PAST SURGICAL HISTORY:  1. Cataract. 2. Right femoral stent. 3. Breast surgery.  4. Cholecystectomy.   FAMILY HISTORY: Mother had heart disease. Father had heart disease. In addition there is family history of asthma, hyperlipidemia and lupus in her sisters.   SOCIAL HISTORY: Patient denies any history of smoking currently; she quit smoking 5 to 6 years ago. Denies any alcohol or drug abuse. She is married and lives with her husband.   REVIEW OF SYSTEMS: CONSTITUTIONAL: Patient denies any fever. Reports fatigue and weakness. EYES: Denies any blurred or double vision. ENT: Has hearing loss. Denies any tinnitus, ear pain. RESPIRATORY: Reports cough, dyspnea. CARDIOVASCULAR: Reports occasional left-sided  chest pain. Denies any palpitations or syncope. GASTROINTESTINAL: Denies any nausea, vomiting, abdominal pain. Reports constipation. GENITOURINARY: Denies any dysuria, hematuria. ENDO: Denies any polyuria or nocturia. HEME/LYMPH: Denies any anemia, easy bruisability. INTEGUMENT: Denies  any acne, rash. MUSCULOSKELETAL: Denies any swelling, gout. NEUROLOGICAL: Denies any numbness, weakness. PSYCH: Denies any anxiety, depression.  PHYSICAL EXAMINATION:  VITAL SIGNS: Temperature 98.3, heart rate 83, respiratory rate 20, blood pressure 128/57, pulse oximetry 98% on room air.   GENERAL: Patient is slightly overweight Caucasian female sitting in bed, not in acute distress. When she speaks for a prolonged period of time she does become short of breath.   HEAD: Atraumatic, normocephalic.   EYES: Mild pallor. No icterus or cyanosis. Pupils are equal, round, and reactive to light and accommodation. Extraocular movements intact.   ENT: Wet mucous membranes. Patient has thrush on her tongue. No oropharyngeal erythema.   NECK: Supple. No masses. No JVD. No thyromegaly. No lymphadenopathy.   CHEST WALL: No tenderness to palpation. Not using accessory muscles of respiration. No intercostal muscle retractions.   LUNGS: Bilaterally clear to auscultation. No wheezing, rales, or rhonchi.   CARDIOVASCULAR: S1, S2 regular. No murmur, rubs, or gallops.   ABDOMEN: Soft, nontender, nondistended. No guarding. No rigidity. No organomegaly. Normal bowel sounds.   SKIN: No rashes or lesions.   PERIPHERIES: No pedal edema. 1+ pedal pulses.   MUSCULOSKELETAL: No cyanosis or clubbing.   NEUROLOGIC: Awake, alert, oriented x3. Nonfocal neurological exam. Cranial nerves grossly intact.   PSYCH: Normal mood and affect.   LABORATORY, DIAGNOSTIC, AND RADIOLOGICAL DATA: Lab work has been ordered but is pending at the time of this dictation.   ASSESSMENT AND PLAN: 81 year old female with past medical history of chronic obstructive pulmonary disease, hypothyroidism, peripheral vascular disease presents with ongoing shortness of breath, weakness, and cough.  1. Acute on chronic respiratory failure, possibly due to interstitial pneumonitis and chronic obstructive pulmonary disease, possibly due to  interstitial pneumonitis. Patient's sputum culture in Dr. Santo HeldKahn's office had grown pseudomonas. Will treat patient with IV cefepime. Will reculture her sputum and also obtain blood cultures to complete her infection work-up. Will check a urinalysis and a chest x-ray.  2. Dyspnea with occasional chest pain. Patient reports that she had a stress test a long time ago. She did have an echocardiogram recently, however, the results are not available as yet. Will try and rule out a cardiac source for the patient's symptoms by checking an EKG, BNP, serial cardiac enzymes and echocardiogram.  3. Constipation. Will give patient one dose of lactulose and start her on MiraLax and Senokot.  4. Oral thrush, likely due to recent oral steroid therapy. Will treat with nystatin suspension.   5. Chronic obstructive pulmonary disease. Currently patient is not in any exacerbation. Will continue patient's Advair, Spiriva, respiratory treatments and theophylline. Will also give IV steroids to help if it helps the patient symptomatically.  6. Hypothyroidism. Will continue Synthroid.  7. Hypertension. Appears to be stable at present. Will continue patient's Benicar and Cardizem. 8. History of gastroesophageal reflux disease. Will place on GI prophylaxis with Zantac.  9. CODE STATUS discussed with the patient in the presence of her husband. Patient is a DO NOT RESUSCITATE.   TIME SPENT: 75 minutes.   ____________________________ Darrick MeigsSangeeta Ivie Savitt, MD sp:cms D: 03/03/2012 14:44:25 ET T: 03/03/2012 15:32:37 ET JOB#: [id number] cc: Darrick MeigsSangeeta Dossie Ocanas, MD, <Dictator> Lyndon CodeFozia M. Khan, MD Darrick MeigsSANGEETA Neilah Fulwider MD ELECTRONICALLY SIGNED 03/04/2012 13:13

## 2015-03-20 NOTE — Discharge Summary (Signed)
PATIENT NAME:  Brittany Booth, Brittany Booth MR#:  [id number] DATE OF BIRTH:  06/27/1928  DATE OF ADMISSION:  03/03/2012 DATE OF DISCHARGE:  03/10/2012  For a detailed note, please see the History and Physical done on admission by Dr. Dava Najjar.   DIAGNOSES AT DISCHARGE:  1. Acute on chronic respiratory failure secondary to chronic obstructive pulmonary disease exacerbation.  2. Chronic obstructive pulmonary disease exacerbation secondary to underlying pneumonia.  3. Pneumonia secondary to Pseudomonas.  4. Hypothyroidism.  5. Hypertension.   DIET: The patient is being discharged on low-sodium diet.   ACTIVITY: As tolerated.   FOLLOWUP: Follow up with Dr. Freda Munro in the next 1 to 2 weeks.  DISCHARGE MEDICATIONS:  1. Ambien 10 mg at bedtime. 2. Cardizem CD 300 mg daily.  3. Spiriva 1 puff daily.  4. Oxygen 2 liters nasal cannula continuously.  5. Xopenex nebulizers every 8 hours as needed.  6. Theophylline 100 mg daily.  7. Synthroid 100 mcg daily.  8. Benicar HCTZ 20/12.5, 1 tab daily.  9. Spiriva 1 puff daily.  10. Combivent 2 puffs q.12 hours. 11. Advair 250/50, 1 puff b.i.d.  12. Prednisone taper starting at 60 mg down to 20 mg and continue maintenance at 20 mg. 13. Ceftazidime 1 mg IV q.12 hours times 9 days.    PERTINENT STUDIES DURING HOSPITAL COURSE:    1. Chest x-ray done on 04/08 showing lung fields to be clear bilaterally. Hypoinflated lung fields suggestive of chronic obstructive pulmonary disease.  2. A sputum culture done on 04/10 growing Pseudomonas and also Achromobacter, both sensitive to ceftazidime.   HOSPITAL COURSE: This is an 81 year old female with medical problems as mentioned above who presented to the hospital on 03/03/2012 due to shortness of breath and worsening respiratory failure.  1. Acute on chronic respiratory failure: This was secondary to chronic obstructive pulmonary disease exacerbation from underlying pneumonia. The patient's chest x-ray was not suggestive  of pneumonia but she had clinical symptoms with a cough, productive sputum, and worsening shortness of breath. The patient was treated with p.o. Levaquin as an outpatient, although she was started here in the hospital on cefepime and also Levaquin, double coverage for possible Pseudomonas. She was also maintained on IV steroids, around-the-clock nebulizer treatments, and her maintenance inhalers. The patient's clinical symptoms have significantly improved since admission. She already is on chronic home oxygen and therefore is being discharged with oxygen and a prednisone taper and antibiotics as mentioned. Her sputum cultures came back to be positive for Pseudomonas, but it was resistant to Levaquin. She was therefore switched from Levaquin and cefepime to [place]. She is being discharged on 10 days of IV Fortaz therapy as mentioned.  2. Chronic obstructive pulmonary disease: The patient did come in with chronic obstructive pulmonary disease exacerbation secondary to underlying pneumonia. As mentioned, the patient was treated aggressively with IV steroids, around-the-clock nebulizer treatments, oxygen and her inhalers. She has significantly improved since admission and is presently being discharged on her maintenance oxygen along with a prednisone taper and antibiotics as mentioned, along with  Spiriva and Advair and theophylline. She will follow up with her pulmonologist Dr. Freda Munro as an outpatient.  3. Pseudomonas pneumonia: As mentioned, initially the patient was treated with Levaquin and cefepime, although when the sputum cultures resulted saying they were resistant to Levaquin,  she was switched over to [place]. She is being discharged now on IV Fortaz for the next nine days to finish a 10-day course for treatment for Pseudomonas pneumonia. She  did have a PICC line placed and Home Health nursing will provide  IV antibiotics for her at home.  4. Hypothyroidism: The patient was maintained on her Synthroid  she will continue that.  5. Hypertension: The patient remained hemodynamically stable on her Benicar and HCTZ and her Cardizem. She will continue that upon discharge, too.  6. Oral thrush: The patient developed some oral thrush as she is on chronic prednisone. She was given some nystatin swish and swallow during the hospital course, and she is being discharged on that presently.  7. CODE STATUS: The patient is a FULL CODE.   DISPOSITION: She is being discharged home with Home Health nursing services for long-term IV antibiotic therapy.    TIME SPENT: 40 minutes.    ____________________________ Rolly PancakeVivek J. Cherlynn KaiserSainani, MD [id number] D: 03/10/2012 16:56:19 ET T: 03/11/2012 15:41:33 ET JOB#: [id number]  cc: Rolly PancakeVivek J. Cherlynn KaiserSainani, MD, <Dictator> Yevonne PaxSaadat A. Khan, MD [place] SirenVIVEK J SAINANI MD ELECTRONICALLY SIGNED 03/12/2012 16:17

## 2015-03-20 NOTE — Consult Note (Signed)
Chief Complaint:   Subjective/Chief Complaint she is slowly improving. Not back to baseline. She was admitted with pseudomonal pulmonary infection having failed outpatient PO therapy with levaquin. patient is now on IV abx. She will need a prolonged course of abx possibly will need home abx therapy. She still has wheeze today. And she is SOB   VITAL SIGNS/ANCILLARY NOTES: **Vital Signs.:   10-Apr-13 05:29   Vital Signs Type Routine   Temperature Temperature (F) 98.2   Celsius 36.7   Temperature Source oral   Pulse Pulse 84   Pulse source per Dinamap   Respirations Respirations 20   Systolic BP Systolic BP 416   Diastolic BP (mmHg) Diastolic BP (mmHg) 70   Mean BP 97   BP Source Dinamap   Pulse Ox % Pulse Ox % 95   Pulse Ox Activity Level  At rest   Oxygen Delivery 2L    08:26   Vital Signs Type POCT   Nurse Fingerstick (mg/dL) FSBS (fasting range 65-99 mg/dL) 171   Comments/Interventions  Nurse Notified    11:30   Vital Signs Type POCT   Nurse Fingerstick (mg/dL) FSBS (fasting range 65-99 mg/dL) 163   Comments/Interventions  No Treatment Required  *Intake and Output.:   Shift 10-Apr-13 07:00   Grand Totals Intake:   Output:  300    Net:  -300 24 Hr.:  -806   Urine ml     Out:  300   Length of Stay Totals Intake:  2736 Output:  4155    Net:  -1419   Brief Assessment:   Cardiac Regular  -- LE edema  --Gallop    Respiratory postive use of accessory muscles  wheezing  rhonchi    Gastrointestinal details normal Soft  Nontender  Bowel sounds normal  No rebound tenderness   Routine Hem:  09-Apr-13 06:47    WBC (CBC) 7.9   RBC (CBC) 4.23   Hemoglobin (CBC) 13.1   Hematocrit (CBC) 38.5   Platelet Count (CBC) 143   MCV 91   MCH 30.9   MCHC 33.9   RDW 15.9   Neutrophil % 94.6   Lymphocyte % 4.6   Monocyte % 0.7   Eosinophil % 0.1   Basophil % 0.0   Neutrophil # 7.5   Lymphocyte # 0.4   Monocyte # 0.1   Eosinophil # 0.0   Basophil # 0.0  Routine Chem:   09-Apr-13 06:47    Glucose, Serum 219   BUN 31   Creatinine (comp) 1.21   Sodium, Serum 137   Potassium, Serum 4.4   Chloride, Serum 100   CO2, Serum 27   Calcium (Total), Serum 8.5   Osmolality (calc) 287   eGFR (African American) 55   eGFR (Non-African American) 45   Anion Gap 10   Magnesium, Serum 1.8  Cardiac:  09-Apr-13 06:47    Troponin I 0.02   CK, Total 28   CPK-MB, Serum 1.2  Blood Glucose:  09-Apr-13 07:48    POCT Blood Glucose 183    11:31    POCT Blood Glucose 190    17:25    POCT Blood Glucose 205    21:15    POCT Blood Glucose 213  Routine Hem:  10-Apr-13 02:47    Platelet Count (CBC) 139  Blood Glucose:  10-Apr-13 08:24    POCT Blood Glucose 171    11:28    POCT Blood Glucose 163   Radiology Results: XRay:    08-Apr-13 14:50,  Chest PA and Lateral   Chest PA and Lateral    REASON FOR EXAM:    cough, shortness of breath  COMMENTS:       PROCEDURE: DXR - DXR CHEST PA (OR AP) AND LATERAL  - Mar 03 2012  2:50PM     RESULT: Comparison is made to the prior exam of 11/15/2011. The lung   fields are clear. No pneumonia, pneumothorax or pleural effusion is seen.   Heart size is normal. The lungs are bilaterally hyperinflated which   suggest a history of COPD or asthma.    IMPRESSION:   1. The lung fields are clear.  2. Heart size is normal.  3. The chest is bilaterally hyperinflated which suggest a history of COPD   or asthma.  Thank you for the opportunity to contribute to the care of your patient.           Verified By: Dionne Ano WALL, M.D., MD  Cardiology:    10-Apr-13 08:01, Echo Doppler   Echo Doppler    Interpretation Summary    Normal chamber size and normal LV systolic , but mild diastolic   function and mild MR.    Procedure:    A two-dimensional transthoracic echocardiogram with color flow and   Doppler was performed.    Left Ventricle    The left ventricle is grossly normal size.    Left ventricular systolic function is low  normal.    Ejection Fraction = 50-55%.    Atria    The left atrial size is normal.    Mitral Valve    There is mild to moderate mitral annular calcification.    There is mild mitral regurgitation.    Tricuspid Valve    There is trace tricuspid regurgitation.    MMode 2D Measurements and Calculations    RVDd: 2.5 cm    IVSd: 1.3 cm    LVIDd: 3.5 cm    LVIDs: 2.6 cm    LVPWd: 1.2 cm    FS: 26 %    EF(Teich): 53 %    Ao root diam: 2.8 cm    LA dimension: 3.4 cm    LVOT diam: 2.0 cm    Doppler Measurements and Calculations    MV E point: 83 cm/sec    MV A point: 121 cm/sec    MV E/A: 0.69     MV dec time: 0.28 sec    Ao V2 max: 149 cm/sec    Ao max PG: 9.0 mmHg    Ao V2 mean: 106 cm/sec    Ao mean PG: 5.0 mmHg    Ao V2 VTI: 31 cm    AVA(I,D): 3.6 cm2    AVA(V,D): 2.6 cm2    LV max PG: 6.0 mmHg    LV mean PG: 4.0 mmHg    LV V1 max: 125 cm/sec    LV V1 mean: 101 cm/sec    LV V1 VTI: 35 cm    SV(LVOT): 111 ml    TR Max vel: 176 cm/sec    TR Max PG: 12 mmHg    RVSP: 17 mmHg    RAP systole: 5.0 mmHg    Reading Physician: Neoma Laming   Sonographer: Sherrie Sport  Interpreting Physician:  Neoma Laming,  electronically signed on   03-05-2012 11:12:25  Requesting Physician: Neoma Laming   Assessment/Plan:  Assessment/Plan:   Assessment 51 yow female with COPD and pseudomonal infection. She has failed outpatient therapy now on IV therapy. Seems  to be responding to the treatment though slowly,. She should have a prolonged course of abx IV if we are to try to keep her symptoms under control. She may return to the hospital if discharged prematurely   Electronic Signatures: Allyne Gee (MD)  (Signed 10-Apr-13 13:33)  Authored: Chief Complaint, VITAL SIGNS/ANCILLARY NOTES, Brief Assessment, Lab Results, Radiology Results, Assessment/Plan   Last Updated: 10-Apr-13 13:33 by Allyne Gee (MD)

## 2015-05-06 ENCOUNTER — Encounter: Payer: Self-pay | Admitting: Emergency Medicine

## 2015-05-06 ENCOUNTER — Emergency Department
Admission: EM | Admit: 2015-05-06 | Discharge: 2015-05-06 | Disposition: A | Discharge status: Home / Self Care | Payer: Medicare Other | Source: Home / Self Care | Attending: Emergency Medicine | Admitting: Emergency Medicine

## 2015-05-06 ENCOUNTER — Emergency Department: Payer: Medicare Other

## 2015-05-06 ENCOUNTER — Other Ambulatory Visit: Payer: Self-pay

## 2015-05-06 DIAGNOSIS — F419 Anxiety disorder, unspecified: Secondary | ICD-10-CM | POA: Diagnosis present

## 2015-05-06 DIAGNOSIS — R072 Precordial pain: Secondary | ICD-10-CM

## 2015-05-06 DIAGNOSIS — Z9981 Dependence on supplemental oxygen: Secondary | ICD-10-CM

## 2015-05-06 DIAGNOSIS — Z87891 Personal history of nicotine dependence: Secondary | ICD-10-CM

## 2015-05-06 DIAGNOSIS — M1990 Unspecified osteoarthritis, unspecified site: Secondary | ICD-10-CM | POA: Diagnosis present

## 2015-05-06 DIAGNOSIS — J9621 Acute and chronic respiratory failure with hypoxia: Secondary | ICD-10-CM | POA: Diagnosis not present

## 2015-05-06 DIAGNOSIS — R911 Solitary pulmonary nodule: Secondary | ICD-10-CM

## 2015-05-06 DIAGNOSIS — K5900 Constipation, unspecified: Secondary | ICD-10-CM | POA: Diagnosis present

## 2015-05-06 DIAGNOSIS — Z91040 Latex allergy status: Secondary | ICD-10-CM | POA: Insufficient documentation

## 2015-05-06 DIAGNOSIS — Z66 Do not resuscitate: Secondary | ICD-10-CM | POA: Diagnosis present

## 2015-05-06 DIAGNOSIS — Z7951 Long term (current) use of inhaled steroids: Secondary | ICD-10-CM

## 2015-05-06 DIAGNOSIS — Z882 Allergy status to sulfonamides status: Secondary | ICD-10-CM

## 2015-05-06 DIAGNOSIS — J441 Chronic obstructive pulmonary disease with (acute) exacerbation: Secondary | ICD-10-CM | POA: Insufficient documentation

## 2015-05-06 DIAGNOSIS — E039 Hypothyroidism, unspecified: Secondary | ICD-10-CM | POA: Diagnosis present

## 2015-05-06 DIAGNOSIS — I10 Essential (primary) hypertension: Secondary | ICD-10-CM

## 2015-05-06 DIAGNOSIS — F329 Major depressive disorder, single episode, unspecified: Secondary | ICD-10-CM | POA: Diagnosis present

## 2015-05-06 DIAGNOSIS — E785 Hyperlipidemia, unspecified: Secondary | ICD-10-CM | POA: Diagnosis present

## 2015-05-06 DIAGNOSIS — J479 Bronchiectasis, uncomplicated: Secondary | ICD-10-CM | POA: Diagnosis present

## 2015-05-06 LAB — CBC
HCT: 38.9 % (ref 35.0–47.0)
HEMOGLOBIN: 12.8 g/dL (ref 12.0–16.0)
MCH: 29.4 pg (ref 26.0–34.0)
MCHC: 33 g/dL (ref 32.0–36.0)
MCV: 89.3 fL (ref 80.0–100.0)
Platelets: 227 10*3/uL (ref 150–440)
RBC: 4.36 MIL/uL (ref 3.80–5.20)
RDW: 17.2 % — ABNORMAL HIGH (ref 11.5–14.5)
WBC: 9.1 10*3/uL (ref 3.6–11.0)

## 2015-05-06 LAB — TROPONIN I: Troponin I: <0.03 ng/mL (ref <0.031)

## 2015-05-06 LAB — COMPREHENSIVE METABOLIC PANEL
ALT: 25 U/L (ref 14–54)
ANION GAP: 8 (ref 5–15)
AST: 21 U/L (ref 15–41)
Albumin: 3.2 g/dL — ABNORMAL LOW (ref 3.5–5.0)
Alkaline Phosphatase: 59 U/L (ref 38–126)
BILIRUBIN TOTAL: 0.6 mg/dL (ref 0.3–1.2)
BUN: 21 mg/dL — ABNORMAL HIGH (ref 6–20)
CALCIUM: 8.7 mg/dL — AB (ref 8.9–10.3)
CHLORIDE: 99 mmol/L — AB (ref 101–111)
CO2: 32 mmol/L (ref 22–32)
Creatinine, Ser: 1.03 mg/dL — ABNORMAL HIGH (ref 0.44–1.00)
GFR calc Af Amer: 56 mL/min — ABNORMAL LOW (ref >60)
GFR, EST NON AFRICAN AMERICAN: 48 mL/min — AB (ref >60)
Glucose, Bld: 79 mg/dL (ref 65–99)
Potassium: 3.8 mmol/L (ref 3.5–5.1)
SODIUM: 139 mmol/L (ref 135–145)
Total Protein: 5.6 g/dL — ABNORMAL LOW (ref 6.5–8.1)

## 2015-05-06 MED ORDER — HYDROCOD POLST-CPM POLST ER 10-8 MG/5ML PO SUER: 5.0000 mL | Freq: Two times a day (BID) | ORAL | Status: DC

## 2015-05-06 MED ORDER — METHYLPREDNISOLONE SODIUM SUCC 125 MG IJ SOLR
INTRAMUSCULAR | Status: AC
  Filled 2015-05-06: qty 2

## 2015-05-06 MED ORDER — IPRATROPIUM-ALBUTEROL 0.5-2.5 (3) MG/3ML IN SOLN
3.0000 mL | Freq: Once | RESPIRATORY_TRACT | Status: AC
  Administered 2015-05-06: 3 mL via RESPIRATORY_TRACT

## 2015-05-06 MED ORDER — IPRATROPIUM-ALBUTEROL 0.5-2.5 (3) MG/3ML IN SOLN
RESPIRATORY_TRACT | Status: AC
  Filled 2015-05-06: qty 3

## 2015-05-06 MED ORDER — METHYLPREDNISOLONE SODIUM SUCC 125 MG IJ SOLR
125.0000 mg | Freq: Once | INTRAMUSCULAR | Status: AC
  Administered 2015-05-06: 125 mg via INTRAVENOUS

## 2015-05-06 MED ORDER — METHYLPREDNISOLONE 4 MG PO TBPK: ORAL_TABLET | ORAL | Status: DC

## 2015-05-06 NOTE — ED Notes (Signed)
Pt assisted to bathroom

## 2015-05-06 NOTE — ED Notes (Signed)
Patient transported to X-ray 

## 2015-05-06 NOTE — ED Notes (Signed)
Pt to ed via ems from home with c/o chest pain started yesterday with productive cough. Pt on home 02 continous 2 liters for copd. Pt with smoking hx and HTN.  Ems reports bp 180/100 and temp of 100 oral. Pt arrives a/ox3, denies any pain at this time. Protocols initiated.

## 2015-05-06 NOTE — ED Provider Notes (Signed)
[facility] Emergency Department Provider Note     Time seen: ----------------------------------------- 8:14 AM on 05/06/2015 -----------------------------------------    I have reviewed the triage vital signs and the nursing notes.   HISTORY  Chief Complaint Chest Pain    HPI Brittany Booth is a 81 y.o. female who presents ED ER from home by EMS for chest pain that started yesterday with productive cough. Patient's on 2 L home O2 for COPD. She does have a smoking history and high blood pressure. EMS reported high blood pressure and oral temperature 100.0. Patient denies any pain at this time, had had some sharp chest pain associated with her cough. Denies any other complaints. Symptoms are mild at this time.   Past Medical History  Diagnosis Date  . Anxiety   . COPD (chronic obstructive pulmonary disease)   . Depression   . Hypertension   . Hyperlipidemia   . Thyroid disease     Hypothyroidism  . Arthritis   . Renal artery stenosis   . Secondary renovascular hypertension, unspecified   . Mitral valve disorders   . Cardiomegaly   . Nonspecific abnormal results of kidney function study     Patient Active Problem List   Diagnosis Date Noted  . Renal artery stenosis 07/09/2013  . Malignant hypertension 07/09/2013  . PAOD (peripheral arterial occlusive disease) 07/09/2013  . COPD, moderate 07/09/2013    Past Surgical History  Procedure Laterality Date  . Breast surgery    . Cholecystectomy    . Eye surgery      Allergies Latex and Sulfa antibiotics  Social History History  Substance Use Topics  . Smoking status: Former Smoker    Types: Cigarettes    Quit date: 07/10/2003  . Smokeless tobacco: Never Used  . Alcohol Use: No    Review of Systems Constitutional: Negative for fever. Eyes: Negative for visual changes. ENT: Negative for sore throat. Cardiovascular: Positive for chest pain Respiratory: Positive for shortness of  breath and cough Gastrointestinal: Negative for abdominal pain, vomiting and diarrhea. Genitourinary: Negative for dysuria. Musculoskeletal: Negative for back pain. Skin: Negative for rash. Neurological: Negative for headaches, focal weakness or numbness.  10-point ROS otherwise negative.  ____________________________________________   PHYSICAL EXAM:  VITAL SIGNS: ED Triage Vitals  Enc Vitals Group     BP 05/06/15 0803 179/84 mmHg     Pulse Rate 05/06/15 0803 83     Resp 05/06/15 0803 23     Temp 05/06/15 0803 98 F (36.7 C)     Temp Source 05/06/15 0803 Oral     SpO2 05/06/15 0803 100 %     Weight 05/06/15 0803 1108 lb (502.586 kg)     Height 05/06/15 0803 5\' 3"  (1.6 m)     Head Cir --      Peak Flow --      Pain Score --      Pain Loc --      Pain Edu? --      Excl. in GC? --     Constitutional: Alert and oriented. Well appearing and in no distress. Eyes: Conjunctivae are normal. PERRL. Normal extraocular movements. ENT   Head: Normocephalic and atraumatic.   Nose: No congestion/rhinnorhea.   Mouth/Throat: Mucous membranes are moist.   Neck: No stridor. Hematological/Lymphatic/Immunilogical: No cervical lymphadenopathy. Cardiovascular: Normal rate, regular rhythm. Normal and symmetric distal pulses are present in all extremities. No murmurs, rubs, or gallops. Respiratory: Patient with mild tachypnea, mild wheezing bilaterally. Gastrointestinal: Soft and  nontender. No distention. No abdominal bruits. There is no CVA tenderness. Musculoskeletal: Nontender with normal range of motion in all extremities. No joint effusions.  No lower extremity tenderness nor edema. Neurologic:  Normal speech and language. No gross focal neurologic deficits are appreciated. Speech is normal. No gait instability. Skin:  Skin is warm, dry and intact. No rash noted. Psychiatric: Mood and affect are normal. Speech and behavior are normal. Patient exhibits appropriate insight and  judgment. ____________________________________________  EKG: Interpreted by me. Normal sinus rhythm with sinus arrhythmia and first-degree AV block. Normal QRS with, normal QT interval. Left axis deviation. No evidence of acute infarction. Rate is 87  ____________________________________________  ED COURSE:  Pertinent labs & imaging results that were available during my care of the patient were reviewed by me and considered in my medical decision making (see chart for details). We'll check chest x-ray as well as cardiac labs and reevaluate. ____________________________________________    LABS (pertinent positives/negatives)  Labs Reviewed  CBC - Abnormal; Notable for the following:    RDW 17.2 (*)    All other components within normal limits  COMPREHENSIVE METABOLIC PANEL - Abnormal; Notable for the following:    Chloride 99 (*)    BUN 21 (*)    Creatinine, Ser 1.03 (*)    Calcium 8.7 (*)    Total Protein 5.6 (*)    Albumin 3.2 (*)    GFR calc non Af Amer 48 (*)    GFR calc Af Amer 56 (*)    All other components within normal limits  TROPONIN I    RADIOLOGY  chest x-ray IMPRESSION: COPD changes with chronic interstitial prominence and stable vague LEFT lung nodular density.  No acute abnormalities. ____________________________________________  FINAL ASSESSMENT AND PLAN  Dyspnea and chest pain  Plan: Pain seems related to cough and COPD. Pain only comes with coughing. Her troponin is negative. Patient will continue home with prednisone taper and breathing treatments. She is in no acute distress, declines hospital admission at this time. Stable for outpatient follow-up.   Emily Filbert, MD   Emily Filbert, MD 05/06/15 [id number]

## 2015-05-06 NOTE — Discharge Instructions (Signed)
Chronic Obstructive Pulmonary Disease Exacerbation °Chronic obstructive pulmonary disease (COPD) is a common lung condition in which airflow from the lungs is limited. COPD is a general term that can be used to describe many different lung problems that limit airflow, including chronic bronchitis and emphysema. COPD exacerbations are episodes when breathing symptoms become much worse and require extra treatment. Without treatment, COPD exacerbations can be life threatening, and frequent COPD exacerbations can cause further damage to your lungs. °CAUSES  °· Respiratory infections.   °· Exposure to smoke.   °· Exposure to air pollution, chemical fumes, or dust. °Sometimes there is no apparent cause or trigger. °RISK FACTORS °· Smoking cigarettes. °· Older age. °· Frequent prior COPD exacerbations. °SIGNS AND SYMPTOMS  °· Increased coughing.   °· Increased thick spit (sputum) production.   °· Increased wheezing.   °· Increased shortness of breath.   °· Rapid breathing.   °· Chest tightness. °DIAGNOSIS  °Your medical history, a physical exam, and tests will help your health care provider make a diagnosis. Tests may include: °· A chest X-ray. °· Basic lab tests. °· Sputum testing. °· An arterial blood gas test. °TREATMENT  °Depending on the severity of your COPD exacerbation, you may need to be admitted to a hospital for treatment. Some of the treatments commonly used to treat COPD exacerbations are:  °· Antibiotic medicines.   °· Bronchodilators. These are drugs that expand the air passages. They may be given with an inhaler or nebulizer. Spacer devices may be needed to help improve drug delivery. °· Corticosteroid medicines. °· Supplemental oxygen therapy.   °HOME CARE INSTRUCTIONS  °· Do not smoke. Quitting smoking is very important to prevent COPD from getting worse and exacerbations from happening as often. °· Avoid exposure to all substances that irritate the airway, especially to tobacco smoke.   °· If you were  prescribed an antibiotic medicine, finish it all even if you start to feel better. °· Take all medicines as directed by your health care provider. It is important to use correct technique with inhaled medicines. °· Drink enough fluids to keep your urine clear or pale yellow (unless you have a medical condition that requires fluid restriction). °· Use a cool mist vaporizer. This makes it easier to clear your chest when you cough.   °· If you have a home nebulizer and oxygen, continue to use them as directed.   °· Maintain all necessary vaccinations to prevent infections.   °· Exercise regularly.   °· Eat a healthy diet.   °· Keep all follow-up appointments as directed by your health care provider. °SEEK IMMEDIATE MEDICAL CARE IF: °· You have worsening shortness of breath.   °· You have trouble talking.   °· You have severe chest pain. °· You have blood in your sputum.  °· You have a fever. °· You have weakness, vomit repeatedly, or faint.   °· You feel confused.   °· You continue to get worse. °MAKE SURE YOU:  °· Understand these instructions. °· Will watch your condition. °· Will get help right away if you are not doing well or get worse. °Document Released: 09/09/2007 Document Revised: 03/29/2014 Document Reviewed: 07/17/2013 °[employer]® Patient Information ©2015 [employer]. This information is not intended to replace advice given to you by your health care provider. Make sure you discuss any questions you have with your health care provider. ° °Chest Wall Pain °Chest wall pain is pain in or around the bones and muscles of your chest. It may take up to 6 weeks to get better. It may take longer if you must stay physically active in   your work and activities.  °CAUSES  °Chest wall pain may happen on its own. However, it may be caused by: °· A viral illness like the flu. °· Injury. °· Coughing. °· Exercise. °· Arthritis. °· Fibromyalgia. °· Shingles. °HOME CARE INSTRUCTIONS  °· Avoid overtiring physical activity. Try  not to strain or perform activities that cause pain. This includes any activities using your chest or your abdominal and side muscles, especially if heavy weights are used. °· Put ice on the sore area. °¨ Put ice in a plastic bag. °¨ Place a towel between your skin and the bag. °¨ Leave the ice on for 15-20 minutes per hour while awake for the first 2 days. °· Only take over-the-counter or prescription medicines for pain, discomfort, or fever as directed by your caregiver. °SEEK IMMEDIATE MEDICAL CARE IF:  °· Your pain increases, or you are very uncomfortable. °· You have a fever. °· Your chest pain becomes worse. °· You have new, unexplained symptoms. °· You have nausea or vomiting. °· You feel sweaty or lightheaded. °· You have a cough with phlegm (sputum), or you cough up blood. °MAKE SURE YOU:  °· Understand these instructions. °· Will watch your condition. °· Will get help right away if you are not doing well or get worse. °Document Released: 11/12/2005 Document Revised: 02/04/2012 Document Reviewed: 07/09/2011 °[employer]® Patient Information ©2015 [employer]. This information is not intended to replace advice given to you by your health care provider. Make sure you discuss any questions you have with your health care provider. ° °

## 2015-05-09 ENCOUNTER — Inpatient Hospital Stay
Admission: AD | Admit: 2015-05-09 | Discharge: 2015-05-12 | DRG: 189 | Disposition: A | Discharge status: Home / Self Care | Payer: Medicare Other | Source: Ambulatory Visit | Attending: Internal Medicine | Admitting: Internal Medicine

## 2015-05-09 ENCOUNTER — Inpatient Hospital Stay: Payer: Medicare Other

## 2015-05-09 ENCOUNTER — Inpatient Hospital Stay (HOSPITAL_COMMUNITY)
Admission: AD | Admit: 2015-05-09 | Discharge: 2015-05-09 | Disposition: A | Discharge status: Home / Self Care | Payer: Medicare Other | Source: Ambulatory Visit | Attending: Internal Medicine | Admitting: Internal Medicine

## 2015-05-09 DIAGNOSIS — I509 Heart failure, unspecified: Secondary | ICD-10-CM

## 2015-05-09 DIAGNOSIS — E785 Hyperlipidemia, unspecified: Secondary | ICD-10-CM | POA: Diagnosis present

## 2015-05-09 DIAGNOSIS — J9621 Acute and chronic respiratory failure with hypoxia: Secondary | ICD-10-CM | POA: Diagnosis present

## 2015-05-09 DIAGNOSIS — Z9981 Dependence on supplemental oxygen: Secondary | ICD-10-CM | POA: Diagnosis not present

## 2015-05-09 DIAGNOSIS — F419 Anxiety disorder, unspecified: Secondary | ICD-10-CM | POA: Diagnosis present

## 2015-05-09 DIAGNOSIS — I5021 Acute systolic (congestive) heart failure: Secondary | ICD-10-CM

## 2015-05-09 DIAGNOSIS — Z882 Allergy status to sulfonamides status: Secondary | ICD-10-CM | POA: Diagnosis not present

## 2015-05-09 DIAGNOSIS — Z87891 Personal history of nicotine dependence: Secondary | ICD-10-CM | POA: Diagnosis not present

## 2015-05-09 DIAGNOSIS — K5900 Constipation, unspecified: Secondary | ICD-10-CM | POA: Diagnosis present

## 2015-05-09 DIAGNOSIS — F329 Major depressive disorder, single episode, unspecified: Secondary | ICD-10-CM | POA: Diagnosis present

## 2015-05-09 DIAGNOSIS — R0603 Acute respiratory distress: Secondary | ICD-10-CM | POA: Diagnosis present

## 2015-05-09 DIAGNOSIS — J479 Bronchiectasis, uncomplicated: Secondary | ICD-10-CM | POA: Diagnosis present

## 2015-05-09 DIAGNOSIS — R0902 Hypoxemia: Secondary | ICD-10-CM

## 2015-05-09 DIAGNOSIS — J441 Chronic obstructive pulmonary disease with (acute) exacerbation: Secondary | ICD-10-CM | POA: Diagnosis present

## 2015-05-09 DIAGNOSIS — Z66 Do not resuscitate: Secondary | ICD-10-CM | POA: Diagnosis present

## 2015-05-09 DIAGNOSIS — E039 Hypothyroidism, unspecified: Secondary | ICD-10-CM | POA: Diagnosis present

## 2015-05-09 DIAGNOSIS — M1990 Unspecified osteoarthritis, unspecified site: Secondary | ICD-10-CM | POA: Diagnosis present

## 2015-05-09 DIAGNOSIS — Z7951 Long term (current) use of inhaled steroids: Secondary | ICD-10-CM | POA: Diagnosis not present

## 2015-05-09 HISTORY — DX: Other ill-defined heart diseases: I51.89

## 2015-05-09 HISTORY — DX: Chronic respiratory failure, unspecified whether with hypoxia or hypercapnia: J96.10

## 2015-05-09 LAB — COMPREHENSIVE METABOLIC PANEL
ALK PHOS: 62 U/L (ref 38–126)
ALT: 26 U/L (ref 14–54)
ANION GAP: 9 (ref 5–15)
AST: 22 U/L (ref 15–41)
Albumin: 3.8 g/dL (ref 3.5–5.0)
BUN: 28 mg/dL — AB (ref 6–20)
CALCIUM: 9.1 mg/dL (ref 8.9–10.3)
CO2: 31 mmol/L (ref 22–32)
CREATININE: 1.08 mg/dL — AB (ref 0.44–1.00)
Chloride: 99 mmol/L — ABNORMAL LOW (ref 101–111)
GFR calc Af Amer: 53 mL/min — ABNORMAL LOW (ref >60)
GFR calc non Af Amer: 45 mL/min — ABNORMAL LOW (ref >60)
GLUCOSE: 160 mg/dL — AB (ref 65–99)
Potassium: 4 mmol/L (ref 3.5–5.1)
Sodium: 139 mmol/L (ref 135–145)
Total Bilirubin: 0.4 mg/dL (ref 0.3–1.2)
Total Protein: 6.4 g/dL — ABNORMAL LOW (ref 6.5–8.1)

## 2015-05-09 LAB — TROPONIN I
Troponin I: 0.03 ng/mL (ref <0.031)
Troponin I: <0.03 ng/mL (ref <0.031)

## 2015-05-09 LAB — CBC
HCT: 43.3 % (ref 35.0–47.0)
Hemoglobin: 13.9 g/dL (ref 12.0–16.0)
MCH: 28.8 pg (ref 26.0–34.0)
MCHC: 32.2 g/dL (ref 32.0–36.0)
MCV: 89.6 fL (ref 80.0–100.0)
PLATELETS: 286 10*3/uL (ref 150–440)
RBC: 4.83 MIL/uL (ref 3.80–5.20)
RDW: 17.8 % — AB (ref 11.5–14.5)
WBC: 11.2 10*3/uL — ABNORMAL HIGH (ref 3.6–11.0)

## 2015-05-09 LAB — PROTIME-INR
INR: 0.89
Prothrombin Time: 12.2 seconds (ref 11.4–15.0)

## 2015-05-09 LAB — BRAIN NATRIURETIC PEPTIDE: B Natriuretic Peptide: 232 pg/mL — ABNORMAL HIGH (ref 0.0–100.0)

## 2015-05-09 MED ORDER — METHYLPREDNISOLONE SODIUM SUCC 125 MG IJ SOLR
60.0000 mg | Freq: Two times a day (BID) | INTRAMUSCULAR | Status: DC
Start: 2015-05-09 — End: 2015-05-11
  Administered 2015-05-09 – 2015-05-11 (×4): 60 mg via INTRAVENOUS
  Filled 2015-05-09 (×4): qty 2

## 2015-05-09 MED ORDER — TRAMADOL HCL 50 MG PO TABS
50.0000 mg | ORAL_TABLET | Freq: Four times a day (QID) | ORAL | Status: DC | PRN
Start: 2015-05-09 — End: 2015-05-12
  Administered 2015-05-12: 50 mg via ORAL

## 2015-05-09 MED ORDER — SENNOSIDES-DOCUSATE SODIUM 8.6-50 MG PO TABS
2.0000 | ORAL_TABLET | Freq: Two times a day (BID) | ORAL | Status: DC
  Administered 2015-05-09 – 2015-05-11 (×4): 2 via ORAL
  Administered 2015-05-12: 1 via ORAL
  Filled 2015-05-09 (×6): qty 2

## 2015-05-09 MED ORDER — ZOLPIDEM TARTRATE 5 MG PO TABS
10.0000 mg | ORAL_TABLET | Freq: Every evening | ORAL | Status: DC | PRN
  Administered 2015-05-10: 10 mg via ORAL
  Filled 2015-05-09: qty 2

## 2015-05-09 MED ORDER — ROFLUMILAST 500 MCG PO TABS
500.0000 ug | ORAL_TABLET | Freq: Every day | ORAL | Status: DC
  Administered 2015-05-09 – 2015-05-12 (×4): 500 ug via ORAL
  Filled 2015-05-09 (×4): qty 1

## 2015-05-09 MED ORDER — ACETAMINOPHEN 650 MG RE SUPP: 650.0000 mg | Freq: Four times a day (QID) | RECTAL | Status: DC | PRN

## 2015-05-09 MED ORDER — ACETAMINOPHEN 325 MG PO TABS
650.0000 mg | ORAL_TABLET | Freq: Four times a day (QID) | ORAL | Status: DC | PRN
  Administered 2015-05-11: 650 mg via ORAL
  Filled 2015-05-09: qty 2

## 2015-05-09 MED ORDER — LORATADINE 10 MG PO TABS
10.0000 mg | ORAL_TABLET | Freq: Every day | ORAL | Status: DC
  Administered 2015-05-10 – 2015-05-11 (×2): 10 mg via ORAL
  Filled 2015-05-09 (×2): qty 1

## 2015-05-09 MED ORDER — HYDRALAZINE HCL 20 MG/ML IJ SOLN
10.0000 mg | Freq: Four times a day (QID) | INTRAMUSCULAR | Status: DC | PRN
  Administered 2015-05-10 (×3): 10 mg via INTRAVENOUS
  Filled 2015-05-09 (×3): qty 1

## 2015-05-09 MED ORDER — DILTIAZEM HCL ER BEADS 300 MG PO CP24
300.0000 mg | ORAL_CAPSULE | Freq: Every day | ORAL | Status: DC
  Administered 2015-05-10 – 2015-05-12 (×3): 300 mg via ORAL
  Filled 2015-05-09 (×5): qty 1

## 2015-05-09 MED ORDER — MIRTAZAPINE 15 MG PO TABS
15.0000 mg | ORAL_TABLET | Freq: Every day | ORAL | Status: DC
  Administered 2015-05-09 – 2015-05-11 (×3): 15 mg via ORAL
  Filled 2015-05-09 (×3): qty 1

## 2015-05-09 MED ORDER — MAGNESIUM HYDROXIDE 400 MG/5ML PO SUSP
30.0000 mL | Freq: Every day | ORAL | Status: DC | PRN
  Administered 2015-05-09: 30 mL via ORAL
  Filled 2015-05-09: qty 30

## 2015-05-09 MED ORDER — SENNA 8.6 MG PO TABS: 1.0000 | ORAL_TABLET | Freq: Every day | ORAL | Status: DC | PRN

## 2015-05-09 MED ORDER — CLONIDINE HCL 0.1 MG PO TABS
0.1000 mg | ORAL_TABLET | Freq: Two times a day (BID) | ORAL | Status: DC
  Administered 2015-05-09 – 2015-05-12 (×7): 0.1 mg via ORAL
  Filled 2015-05-09 (×7): qty 1

## 2015-05-09 MED ORDER — HYDRALAZINE HCL 10 MG PO TABS
10.0000 mg | ORAL_TABLET | Freq: Three times a day (TID) | ORAL | Status: DC
  Administered 2015-05-09 – 2015-05-10 (×3): 10 mg via ORAL
  Filled 2015-05-09 (×3): qty 1

## 2015-05-09 MED ORDER — LEVOFLOXACIN 500 MG PO TABS
500.0000 mg | ORAL_TABLET | Freq: Every day | ORAL | Status: DC
  Administered 2015-05-09: 500 mg via ORAL

## 2015-05-09 MED ORDER — ALBUTEROL SULFATE (2.5 MG/3ML) 0.083% IN NEBU
2.5000 mg | INHALATION_SOLUTION | RESPIRATORY_TRACT | Status: DC | PRN
  Administered 2015-05-11 – 2015-05-12 (×4): 2.5 mg via RESPIRATORY_TRACT
  Filled 2015-05-09 (×4): qty 3

## 2015-05-09 MED ORDER — LEVOFLOXACIN 250 MG PO TABS
250.0000 mg | ORAL_TABLET | Freq: Every day | ORAL | Status: DC
  Administered 2015-05-10 – 2015-05-12 (×3): 250 mg via ORAL
  Filled 2015-05-09 (×3): qty 1

## 2015-05-09 MED ORDER — TIOTROPIUM BROMIDE MONOHYDRATE 18 MCG IN CAPS
18.0000 ug | ORAL_CAPSULE | Freq: Every day | RESPIRATORY_TRACT | Status: DC
  Administered 2015-05-10 – 2015-05-12 (×3): 18 ug via RESPIRATORY_TRACT
  Filled 2015-05-09: qty 5

## 2015-05-09 MED ORDER — FUROSEMIDE 10 MG/ML IJ SOLN
40.0000 mg | Freq: Two times a day (BID) | INTRAMUSCULAR | Status: DC
  Administered 2015-05-10 – 2015-05-11 (×4): 40 mg via INTRAVENOUS
  Filled 2015-05-09 (×5): qty 4

## 2015-05-09 MED ORDER — HEPARIN SODIUM (PORCINE) 5000 UNIT/ML IJ SOLN
5000.0000 [IU] | Freq: Three times a day (TID) | INTRAMUSCULAR | Status: DC
  Administered 2015-05-09 – 2015-05-12 (×9): 5000 [IU] via SUBCUTANEOUS
  Filled 2015-05-09 (×9): qty 1

## 2015-05-09 MED ORDER — MONTELUKAST SODIUM 10 MG PO TABS
10.0000 mg | ORAL_TABLET | Freq: Every day | ORAL | Status: DC
  Administered 2015-05-09 – 2015-05-10 (×2): 10 mg via ORAL
  Filled 2015-05-09 (×2): qty 1

## 2015-05-09 MED ORDER — IPRATROPIUM-ALBUTEROL 0.5-2.5 (3) MG/3ML IN SOLN
3.0000 mL | RESPIRATORY_TRACT | Status: DC
  Administered 2015-05-09 – 2015-05-10 (×6): 3 mL via RESPIRATORY_TRACT
  Filled 2015-05-09 (×6): qty 3

## 2015-05-09 MED ORDER — ALPRAZOLAM 0.25 MG PO TABS
0.2500 mg | ORAL_TABLET | Freq: Every evening | ORAL | Status: DC | PRN
  Administered 2015-05-10: 0.25 mg via ORAL
  Filled 2015-05-09: qty 1

## 2015-05-09 MED ORDER — CLONIDINE HCL 0.1 MG/24HR TD PTWK: 0.1000 mg | MEDICATED_PATCH | TRANSDERMAL | Status: DC

## 2015-05-09 MED ORDER — LEVOFLOXACIN 500 MG PO TABS
500.0000 mg | ORAL_TABLET | Freq: Once | ORAL | Status: AC
  Administered 2015-05-09: 500 mg via ORAL
  Filled 2015-05-09: qty 1

## 2015-05-09 MED ORDER — DOCUSATE SODIUM 100 MG PO CAPS: 100.0000 mg | ORAL_CAPSULE | Freq: Two times a day (BID) | ORAL | Status: DC | PRN

## 2015-05-09 MED ORDER — LEVOTHYROXINE SODIUM 112 MCG PO TABS
112.0000 ug | ORAL_TABLET | Freq: Every day | ORAL | Status: DC
  Administered 2015-05-10 – 2015-05-12 (×3): 112 ug via ORAL
  Filled 2015-05-09 (×3): qty 1

## 2015-05-09 NOTE — Progress Notes (Signed)
*  PRELIMINARY RESULTS* Echocardiogram 2D Echocardiogram has been performed.  Brittany Booth Hege 05/09/2015, 2:40 PM

## 2015-05-09 NOTE — Progress Notes (Signed)
2 L of oxygen. NSR. Takes meds ok. Meds given for constipation. Pt has not reported any pain. CXr neg. ECHO completed. BUn and Cr elevated. Pt given abx and steriods. Pt has no further concerns at this time.

## 2015-05-09 NOTE — Consult Note (Signed)
Cardiology Consultation Note  Patient ID: Brittany Booth, MRN: [id number], DOB/AGE: 79/19/1929 79 y.o. Admit date: 05/09/2015   Date of Consult: 05/09/2015 Primary Physician: Lyndon Code, MD Primary Cardiologist: New to [facility]  Chief Complaint: Chest pain and increased dyspnea x 4 days Reason for Consult: Possible CHF exacerbation   HPI: 81 y.o. female with h/o chronic respiratory failure 2/2 COPD on 2.5L home oxygen, reported history of chronic diastolic CHF, HTN, HLD, renal artery stenosis s/p renal artery stenting, hypothyroidism, and depression/anxiety who recently presented to [facility] ED on 6/10 with complaint of chest pain and was discharged from the ED with outpatient follow up with her PCP. She was directly admitted to [facility] today from her pulmonologist 2/2 increased dyspnea.   Patient was previously followed by Dr. Juliann Pares during two separate hospital admissions for chest pain. Back in 2002 she had a positive stress test and was admitted for cardiac catheterization. Unfortunately, there is no further documentation regarding this cardiac cath in [place]. It is unclear if she required any cardiac interventions at that time. She presented to [facility] in 2011 for chest pain, which was felt to be non-cardiac at that time. No further work up was pursued. She has not seen a cardiologist since then.   She has a long smoking history and had smoked for many years. She has dealt with COPD for at least 8-10 years per her report. She was previously on 2L home O2, but was recently increased to 2.5L a couple months ago 2/2 increased work of breathing. Her weight has consistently been in the 116-120 range for the past several years. She really does not watch her salt or fluid intake.   She presented to [facility] on 6/10 with substernal chest pain that developed after bending over while cooking dinner. She was noted to have a temp of 100.0 and a BP of 179/84. She had a troponin negative x 1. CXR showed COPD changes with  chronic interstitial prominence and stable vague left lung nodular density. She was discharged home on prednisone with outpatient follow up with her PCP.   She followed up with her pulmonologist on 6/13 who reportedly noted she was having increased dyspnea and directly admitted her. She was noted to have increased cough of white to yellow sputum. She continued to have the above chest pain, worse with coughing. SOB persisted. No orthopnea, PND, increased lower edema, nausea, vomiting, presyncope, or syncope.   Upon her arrival to [facility] she was found to have a pulse ox of 94% on 2L via nasal cannula. CXR showed lungs are clear, no pleural fluid. Heart is normal size. WBC count 11.2 (on prednisone). SCr 1.08 BUN 28. Troponin pending. Sputum culture pending. Echo has been performed with a preliminary result of normal LV function with normal right-sided pressures. Her chest pain has currently resolved.         Past Medical History  Diagnosis Date  . Anxiety   . COPD (chronic obstructive pulmonary disease)   . Depression   . Hypertension   . Hyperlipidemia   . Thyroid disease     Hypothyroidism  . Arthritis   . Renal artery stenosis     a. s/p stenting  . Secondary renovascular hypertension, unspecified   . Chronic respiratory failure     a. on 2.5L O2 at home via nasal cannula      Most Recent Cardiac Studies: none   Surgical History:  Past Surgical History  Procedure Laterality Date  . Breast surgery    .  Eye surgery       Home Meds: Prior to Admission medications   Medication Sig Start Date End Date Taking? Authorizing Provider  ALPRAZolam Prudy Feeler) 0.25 MG tablet Take 0.25 mg by mouth at bedtime as needed for sleep.   Yes Historical Provider, MD  CARDIZEM CD 300 MG 24 hr capsule  05/07/13  Yes Historical Provider, MD  fexofenadine (ALLEGRA) 180 MG tablet Take 180 mg by mouth daily.   Yes Historical Provider, MD  furosemide (LASIX) 20 MG tablet Take 20 mg by mouth as needed.   Yes  Historical Provider, MD  hydrALAZINE (APRESOLINE) 10 MG tablet Take 10 mg by mouth 3 (three) times daily.   Yes Historical Provider, MD  Ipratropium-Albuterol (COMBIVENT RESPIMAT) 20-100 MCG/ACT AERS respimat Inhale 1 puff into the lungs every 6 (six) hours.   Yes Historical Provider, MD  levalbuterol (XOPENEX) 1.25 MG/3ML nebulizer solution Take 1.25 mg by nebulization every 4 (four) hours as needed for wheezing.   Yes Historical Provider, MD  levothyroxine (SYNTHROID, LEVOTHROID) 112 MCG tablet Take 112 mcg by mouth daily before breakfast.   Yes Historical Provider, MD  OXYGEN-HELIUM IN Inhale 2 L into the lungs continuous.   Yes Historical Provider, MD  predniSONE (DELTASONE) 5 MG tablet Take 5 mg by mouth daily with breakfast.   Yes Historical Provider, MD  tiotropium (SPIRIVA) 18 MCG inhalation capsule Place 18 mcg into inhaler and inhale daily.   Yes Historical Provider, MD  zolpidem (AMBIEN) 10 MG tablet Take 10 mg by mouth at bedtime as needed for sleep.   Yes Historical Provider, MD  diltiazem (CARDIZEM LA) 300 MG 24 hr tablet Take 300 mg by mouth daily.    Historical Provider, MD    Inpatient Medications:  . cloNIDine  0.1 mg Transdermal Weekly  . cloNIDine  0.1 mg Oral BID  . diltiazem  300 mg Oral Daily  . furosemide  40 mg Intravenous Q12H  . heparin  5,000 Units Subcutaneous 3 times per day  . hydrALAZINE  10 mg Oral TID  . ipratropium-albuterol  3 mL Nebulization Q4H  . [START ON 05/10/2015] levofloxacin  250 mg Oral Daily  . [START ON 05/10/2015] levothyroxine  112 mcg Oral QAC breakfast  . loratadine  10 mg Oral Daily  . methylPREDNISolone (SOLU-MEDROL) injection  60 mg Intravenous Q12H  . mirtazapine  15 mg Oral QHS  . montelukast  10 mg Oral QHS  . roflumilast  500 mcg Oral Daily  . senna-docusate  2 tablet Oral BID  . tiotropium  18 mcg Inhalation Daily      Allergies:  Allergies  Allergen Reactions  . Latex   . Sulfa Antibiotics     History   Social History   . Marital Status: Married    Spouse Name: N/A  . Number of Children: N/A  . Years of Education: N/A   Occupational History  . Not on file.   Social History Main Topics  . Smoking status: Former Smoker    Types: Cigarettes    Quit date: 07/10/2003  . Smokeless tobacco: Never Used  . Alcohol Use: No  . Drug Use: No  . Sexual Activity: Not on file   Other Topics Concern  . Not on file   Social History Narrative     Family History  Problem Relation Age of Onset  . Lupus Father   . Asthma Other   . Heart disease Other   . Hyperlipidemia Other      Review of  Systems: Review of Systems  Constitutional: Positive for fever, chills, weight loss and malaise/fatigue. Negative for diaphoresis.  HENT: Negative for congestion, hearing loss and sore throat.   Eyes: Negative for blurred vision, discharge and redness.  Respiratory: Positive for cough, sputum production and shortness of breath. Negative for hemoptysis and wheezing.        Sputum white to yellow  Cardiovascular: Positive for chest pain. Negative for palpitations, orthopnea, claudication, leg swelling and PND.  Gastrointestinal: Negative for heartburn, nausea, vomiting, abdominal pain, diarrhea, constipation, blood in stool and melena.  Genitourinary: Negative for dysuria and hematuria.  Musculoskeletal: Positive for myalgias. Negative for back pain, joint pain, falls and neck pain.  Skin: Negative for itching and rash.  Neurological: Positive for weakness. Negative for dizziness, tingling, tremors, speech change, focal weakness and headaches.  Endo/Heme/Allergies: Bruises/bleeds easily.  Psychiatric/Behavioral: Negative for depression. The patient is not nervous/anxious.   All other systems reviewed and are negative.    Labs: No results for input(s): CKTOTAL, CKMB, TROPONINI in the last 72 hours. Lab Results  Component Value Date   WBC 11.2* 05/09/2015   HGB 13.9 05/09/2015   HCT 43.3 05/09/2015   MCV 89.6  05/09/2015   PLT 286 05/09/2015     Recent Labs Lab 05/09/15 1357  NA 139  K 4.0  CL 99*  CO2 31  BUN 28*  CREATININE 1.08*  CALCIUM 9.1  PROT 6.4*  BILITOT 0.4  ALKPHOS 62  ALT 26  AST 22  GLUCOSE 160*   No results found for: CHOL, HDL, LDLCALC, TRIG No results found for: DDIMER  Radiology/Studies:  Dg Chest 2 View  05/09/2015   CLINICAL DATA:  Cough and shortness of breath with chest pain.  EXAM: CHEST  2 VIEW  COMPARISON:  05/06/2015.  FINDINGS: Trachea is midline. Heart size normal. Lungs are clear. No pleural fluid. Osteopenia.  IMPRESSION: No acute findings.   Electronically Signed   By: Leanna Battles M.D.   On: 05/09/2015 13:30   Dg Chest 2 View  05/06/2015   CLINICAL DATA:  Chest pain, history anxiety, COPD, hypertension, former smoker  EXAM: CHEST  2 VIEW  COMPARISON:  09/30/2014  FINDINGS: Enlargement of cardiac silhouette.  Atherosclerotic calcification and tortuosity of thoracic aorta.  Mediastinal contours and pulmonary vascularity normal.  Emphysematous and bronchitic changes consistent with COPD.  Mild chronic interstitial prominence stable.  Nodular focus LEFT mid lung 9 mm diameter unchanged, assessed by interval CT.  No definite acute infiltrate, pleural effusion, or pneumothorax.  Diffuse osseous demineralization.  IMPRESSION: COPD changes with chronic interstitial prominence and stable vague LEFT lung nodular density.  No acute abnormalities.   Electronically Signed   By: Ulyses Southward M.D.   On: 05/06/2015 09:02    EKG: NSR with sinus arrhythmia with 1st degree AVB, 74 bpm, occasional PVC, left anterior fascicular block, left axis deviation, bifascicular block, poor R wave progression, no significant st/t changes   Weights: Filed Weights   05/09/15 1230  Weight: 117 lb 8 oz (53.298 kg)     Physical Exam: Blood pressure 186/84, pulse 86, temperature 98.3 F (36.8 C), temperature source Oral, resp. rate 22, height  (1.6 m), weight 117 lb 8 oz (53.298  kg), SpO2 94 %. Body mass index is 20.82 kg/(m^2). General: Well developed, well nourished, in no acute distress. Head: Normocephalic, atraumatic, sclera non-icteric, no xanthomas, nares are without discharge.  Neck: Negative for carotid bruits. JVD not elevated. Lungs: Coarse breath sounds bilaterally with decreased  breath sounds. No crackles. Breathing is unlabored. Heart: RRR with S1 S2. No murmurs, rubs, or gallops appreciated. Abdomen: Soft, non-tender, non-distended with normoactive bowel sounds. No hepatomegaly. No rebound/guarding. No obvious abdominal masses. Msk:  Strength and tone appear normal for age. Extremities: No clubbing or cyanosis. No edema.  Multiple bruises throughout. Distal pedal pulses are 2+ and equal bilaterally. Neuro: Alert and oriented X 3. No facial asymmetry. No focal deficit. Moves all extremities spontaneously. Psych:  Responds to questions appropriately with a normal affect.    Assessment and Plan:  81 y.o. female with h/o chronic respiratory failure 2/2 COPD on 2.5L home oxygen, reported history of chronic diastolic CHF, HTN, HLD, renal artery stenosis s/p renal artery stenting, hypothyroidism, and depression/anxiety who recently presented to [facility] ED on 6/10 with complaint of chest pain and was discharged from the ED with outpatient follow up with her PCP. She was directly admitted to [facility] today from her pulmonologist 2/2 acute on chronic respiratory failure.   1. Acute on chronic respiratory failure: -Preliminary echo result appears to show normal LV systolic function, no wall motion abnormalities, and normal right-sided pressures -Suspect the underlying cause of her acute worsening symptoms is more pulmonary with COPD exacerbation at this time -Check BNP (SCr 1.08) -May not need much IV diuresis, await formal echo read and BNP  2. COPD exacerbation: -Leading to #1 -On 2.5L oxygen at home for the past couple of months, could increase her to baseline of  2.5L O2 -Has chronic bronchiectasis with increased sputum production  -Sputum cultures pending -On IV Solu-Medrol and inhalers per IM -Levaquin   3. HTN: -Uncontrolled at ED visit on 6/10, uncontrolled upon admission 6/13 -IV hydralazine has been added -Continue home medications  4. Constipation: -She reports no BM x 1 week -On stool softeners and laxatives   5. Hypothyroidism: -Synthyroid    Signed, Eula Listen, PA-C Pager: [phone] 05/09/2015, 2:33 PM

## 2015-05-09 NOTE — H&P (Signed)
[facility] - [place] at [facility]   PATIENT NAME: Brittany Booth    MR#:  [id number]  DATE OF BIRTH:  July 03, 1928  DATE OF ADMISSION:  05/09/2015  PRIMARY CARE PHYSICIAN: Lyndon Code, MD   REQUESTING/REFERRING PHYSICIAN: Dr. Carolynne Edouard  CHIEF COMPLAINT:  No chief complaint on file. Dyspnea  HISTORY OF PRESENT ILLNESS:  Brittany Booth  is a 81 y.o. female with a known history of chronic respiratory failure secondary to COPD on 2 L home oxygen, diastolic CHF, hypertension and arthritis presents to the hospital from pulmonologist office secondary to acute respiratory distress and anasarca. Patient has been having worsening dyspnea than baseline, worsening cough with mucoid and yellowish phlegm. She presented to the ER 3 days ago, and was discharged on a prednisone taper and DuoNeb's. She continues to continued to get worse and also having worsening pedal edema and presented to Dr. Milta Deiters office. He felt that she was in CHF exacerbation and sent her for direct admission. She denies any worse or chills. She has chronic bronchiectasis. She is also having midsternal chest pain from constant coughing.  PAST MEDICAL HISTORY:   Past Medical History  Diagnosis Date  . Anxiety   . COPD (chronic obstructive pulmonary disease)   . Depression   . Hypertension   . Hyperlipidemia   . Thyroid disease     Hypothyroidism  . Arthritis   . Renal artery stenosis   . Secondary renovascular hypertension, unspecified   . Mitral valve disorders   . Cardiomegaly   . Nonspecific abnormal results of kidney function study     PAST SURGICAL HISTORY:   Past Surgical History  Procedure Laterality Date  . Breast surgery    . Eye surgery      SOCIAL HISTORY:   History  Substance Use Topics  . Smoking status: Former Smoker    Types: Cigarettes    Quit date: 07/10/2003  . Smokeless tobacco: Never Used  . Alcohol Use: No    FAMILY HISTORY:   Family History  Problem  Relation Age of Onset  . Lupus Father   . Asthma Other   . Heart disease Other   . Hyperlipidemia Other     DRUG ALLERGIES:   Allergies  Allergen Reactions  . Latex   . Sulfa Antibiotics     REVIEW OF SYSTEMS:   Review of Systems  Constitutional: Negative for fever, chills, weight loss and malaise/fatigue.  HENT: Positive for hearing loss. Negative for ear discharge, ear pain, nosebleeds and tinnitus.        Uses hearing aids  Eyes: Positive for blurred vision. Negative for double vision and photophobia.  Respiratory: Positive for cough, sputum production, shortness of breath and wheezing. Negative for hemoptysis.   Cardiovascular: Positive for chest pain and leg swelling. Negative for palpitations and orthopnea.  Gastrointestinal: Positive for constipation. Negative for heartburn, nausea, vomiting, abdominal pain, diarrhea and melena.  Genitourinary: Negative for dysuria, urgency, frequency and hematuria.  Musculoskeletal: Negative for myalgias, back pain and neck pain.  Skin: Negative for rash.  Neurological: Negative for dizziness, tingling, tremors, sensory change, speech change, focal weakness and headaches.  Endo/Heme/Allergies: Does not bruise/bleed easily.  Psychiatric/Behavioral: Negative for depression.    MEDICATIONS AT HOME:   Prior to Admission medications   Medication Sig Start Date End Date Taking? Authorizing Provider  ALPRAZolam Prudy Feeler) 0.25 MG tablet Take 0.25 mg by mouth at bedtime as needed for sleep.   Yes Historical Provider, MD  CARDIZEM CD  300 MG 24 hr capsule  05/07/13  Yes Historical Provider, MD  fexofenadine (ALLEGRA) 180 MG tablet Take 180 mg by mouth daily.   Yes Historical Provider, MD  furosemide (LASIX) 20 MG tablet Take 20 mg by mouth as needed.   Yes Historical Provider, MD  hydrALAZINE (APRESOLINE) 10 MG tablet Take 10 mg by mouth 3 (three) times daily.   Yes Historical Provider, MD  Ipratropium-Albuterol (COMBIVENT RESPIMAT) 20-100 MCG/ACT  AERS respimat Inhale 1 puff into the lungs every 6 (six) hours.   Yes Historical Provider, MD  levalbuterol (XOPENEX) 1.25 MG/3ML nebulizer solution Take 1.25 mg by nebulization every 4 (four) hours as needed for wheezing.   Yes Historical Provider, MD  levothyroxine (SYNTHROID, LEVOTHROID) 112 MCG tablet Take 112 mcg by mouth daily before breakfast.   Yes Historical Provider, MD  OXYGEN-HELIUM IN Inhale 2 L into the lungs continuous.   Yes Historical Provider, MD  predniSONE (DELTASONE) 5 MG tablet Take 5 mg by mouth daily with breakfast.   Yes Historical Provider, MD  tiotropium (SPIRIVA) 18 MCG inhalation capsule Place 18 mcg into inhaler and inhale daily.   Yes Historical Provider, MD  zolpidem (AMBIEN) 10 MG tablet Take 10 mg by mouth at bedtime as needed for sleep.   Yes Historical Provider, MD  diltiazem (CARDIZEM LA) 300 MG 24 hr tablet Take 300 mg by mouth daily.    Historical Provider, MD      VITAL SIGNS:  Blood pressure 186/84, pulse 86, temperature 98.3 F (36.8 C), temperature source Oral, resp. rate 22, height 5\' 3"  (1.6 m), weight 53.298 kg (117 lb 8 oz), SpO2 94 %.  PHYSICAL EXAMINATION:   Physical Exam  GENERAL:  81 y.o.-year-old patient lying in the bed with no acute distress.  EYES: Pupils equal, round, reactive to light and accommodation. No scleral icterus. Extraocular muscles intact.  HEENT: Head atraumatic, normocephalic. Oropharynx and nasopharynx clear.  NECK:  Supple, no jugular venous distention. No thyroid enlargement, no tenderness.  LUNGS: Moving air bilaterally, coarse wheezing and also crackles posteriorly mostly in the lower two thirds. Minimal use of accessory muscles on exertion noted. CARDIOVASCULAR: S1 and S2 normal and regular, 3/6 systolic murmur present. No rubs or gallops.  ABDOMEN: Soft, nontender, nondistended. Bowel sounds present. No organomegaly or mass.  EXTREMITIES: No cyanosis, or clubbing. 1+ pedal edema bilaterally noted. Also right ankle  bruise from blunt injury today and the hospital by hitting the bed NEUROLOGIC: Cranial nerves II through XII are intact. Muscle strength 5/5 in all extremities. Sensation intact. Gait not checked.  PSYCHIATRIC: The patient is alert and oriented x 3.  SKIN: No obvious rash, lesion, or ulcer.   LABORATORY PANEL:   CBC  Recent Labs Lab 05/06/15 0807  WBC 9.1  HGB 12.8  HCT 38.9  PLT 227   ------------------------------------------------------------------------------------------------------------------  Chemistries   Recent Labs Lab 05/06/15 0807  NA 139  K 3.8  CL 99*  CO2 32  GLUCOSE 79  BUN 21*  CREATININE 1.03*  CALCIUM 8.7*  AST 21  ALT 25  ALKPHOS 59  BILITOT 0.6   ------------------------------------------------------------------------------------------------------------------  Cardiac Enzymes  Recent Labs Lab 05/06/15 0807  TROPONINI <0.03   ------------------------------------------------------------------------------------------------------------------  RADIOLOGY:  No results found.  EKG:   Orders placed or performed during the hospital encounter of 05/09/15  . EKG 12-Lead  . EKG 12-Lead    IMPRESSION AND PLAN:   81 year old female with past medical history significant for chronic respiratory failure secondary to COPD on 2  L home oxygen, hypertension, depression, diastolic CHF presents to the hospital from her pulmonologist office secondary to worsening respiratory distress  #1 acute on chronic respiratory distress-secondary to CHF exacerbation and COPD exacerbation. -Admit to telemetry, recycle troponins.  -2-D echocardiogram has been ordered. Patient does not have a cardiologist so we'll consult [place] cardiology. -Started on IV Lasix twice a day at this time.  #2 COPD exacerbation-chronic steroid-dependent. Started on IV Solu-Medrol here, continue any inhalers and also on 2 units at this time. -Has chronic bronchiectasis with increased  sputum production and low-grade fever 3 days ago. -Chest x-ray has been ordered and will start on Levaquin at this time. -Sputum cultures ordered -Patient also on Singulair and Daliresp  #3 hypertension-restarted her home medications, IV hydralazine when necessary for elevated blood pressure  #4 constipation-stool softeners and laxities have been added.  #5 hypothyroidism-on Synthroid  #6 DVT prophylaxis-continue subcutaneous heparin.   All the records are reviewed and case discussed with ED provider. Management plans discussed with the patient, family and they are in agreement.  CODE STATUS: FULL CODE  TOTAL TIME TAKING CARE OF THIS PATIENT: 50  minutes.    Enid Baas M.D on 05/09/2015 at 1:17 PM  Between 7am to 6pm - Pager - [phone]  After 6pm go to [email or web address] - password [email or web address] [facility]  [facility]  Office  [phone]  CC: Primary care physician; Lyndon Code, MD

## 2015-05-09 NOTE — Progress Notes (Signed)
Levaquin Dosing Patient is a 81yo female admitted for COPD exacerbation and empirically started on Levaquin 500mg  PO daily for same.  Est.CrCl=51ml/min  Will renally adjust dose to Levaquin 500mg  PO once followed by 250mg  PO daily.  Clovia Cuff, PharmD, BCPS 05/09/2015 2:07 PM

## 2015-05-09 NOTE — Evaluation (Signed)
Physical Therapy Evaluation Patient Details Name: Brittany Booth MRN: [id number] DOB: Mar 23, 1928 Today's Date: 05/09/2015   History of Present Illness  Pt is an 81 year old female who was admitted to the hospital for chest pain and respiratory distress. Pt has history of COPD, CHF, and HTN.   Clinical Impression  Pt is an 81 year old female who was admitted to the hospital with ARDS and a history of COPD, CHF, and HTN. Pt ambulates 130 ft with CGA with RW before she begins to get fatigued and oxygen saturation fall (88 % at end of activity.) Vitals prior to activity - oxygen: 96 %, HR: 77. Pt transfers with min assist for support and without RW, but experiences sternal pain during transfer activity. Pt's main deficits include lack of endurance secondary to respiratory distress and hx of COPD. Pt will benefit from skilled physical therapy for better endurance during household and community ambulation, as she states she is largely only a household ambulator currently.     Follow Up Recommendations Home health PT    Equipment Recommendations  Rolling walker with 5" wheels    Recommendations for Other Services       Precautions / Restrictions Restrictions Weight Bearing Restrictions: No      Mobility  Bed Mobility Overal bed mobility: Independent                Transfers Overall transfer level: Needs assistance Equipment used: None Transfers: Sit to/from Stand Sit to Stand: Min assist         General transfer comment: sit -> stand was min assist for support.  (Pt stated chest pain during the transfer)  Ambulation/Gait Ambulation/Gait assistance: Modified independent (Device/Increase time);Min guard Ambulation Distance (Feet): 130 Feet Assistive device: Rolling walker (2 wheeled) Gait Pattern/deviations: Step-through pattern;Decreased stride length     General Gait Details: Pt's gait is slow, but appears smoother with a RW. She stated that it felt better with the RW  here than without it while at home  Stairs            Wheelchair Mobility    Modified Rankin (Stroke Patients Only)       Balance Overall balance assessment: History of Falls                                           Pertinent Vitals/Pain Pain Assessment: 0-10 Pain Score: 5  Pain Location: Sternal/left ribs    Home Living Family/patient expects to be discharged to:: Private residence Living Arrangements: Spouse/significant other Available Help at Discharge: Family   Home Access:  (single level)     Home Layout: One level Home Equipment: Cane - quad Additional Comments: Has a cane but does not use it    Prior Function Level of Independence: Independent         Comments: Pt was largely a household ambulator prior.      Hand Dominance        Extremity/Trunk Assessment   Upper Extremity Assessment: Overall WFL for tasks assessed           Lower Extremity Assessment: Overall WFL for tasks assessed (Pt has sore on right shin from bumping it )         Communication   Communication: No difficulties  Cognition Arousal/Alertness: Awake/alert Behavior During Therapy: WFL for tasks assessed/performed Overall Cognitive Status: Within Functional Limits for tasks  assessed                      General Comments      Exercises        Assessment/Plan    PT Assessment Patient needs continued PT services  PT Diagnosis Other (comment) (Decreased endurance secondary to respiratory distress )   PT Problem List Decreased strength;Decreased activity tolerance;Decreased range of motion;Decreased mobility;Cardiopulmonary status limiting activity  PT Treatment Interventions     PT Goals (Current goals can be found in the Care Plan section) Acute Rehab PT Goals Patient Stated Goal: To walk better and breath better PT Goal Formulation: With patient Potential to Achieve Goals: Good    Frequency Min 2X/week   Barriers to  discharge        Co-evaluation               End of Session Equipment Utilized During Treatment: Gait belt;Oxygen Activity Tolerance: Patient tolerated treatment well Patient left: in chair;with call bell/phone within reach Nurse Communication: Mobility status         Time: 1429-1501 PT Time Calculation (min) (ACUTE ONLY): 32 min   Charges:         PT G CodesBenna Dunks 2015-06-07, 3:50 PM   Benna Dunks, SPT. [phone]

## 2015-05-09 NOTE — Subjective & Objective (Signed)
Dyspnea

## 2015-05-10 ENCOUNTER — Encounter: Payer: Self-pay | Admitting: Physician Assistant

## 2015-05-10 LAB — BASIC METABOLIC PANEL
Anion gap: 13 (ref 5–15)
BUN: 28 mg/dL — AB (ref 6–20)
CO2: 35 mmol/L — ABNORMAL HIGH (ref 22–32)
Calcium: 9.6 mg/dL (ref 8.9–10.3)
Chloride: 97 mmol/L — ABNORMAL LOW (ref 101–111)
Creatinine, Ser: 1.07 mg/dL — ABNORMAL HIGH (ref 0.44–1.00)
GFR, EST AFRICAN AMERICAN: 53 mL/min — AB (ref >60)
GFR, EST NON AFRICAN AMERICAN: 46 mL/min — AB (ref >60)
Glucose, Bld: 95 mg/dL (ref 65–99)
POTASSIUM: 3.3 mmol/L — AB (ref 3.5–5.1)
Sodium: 145 mmol/L (ref 135–145)

## 2015-05-10 LAB — CBC
HCT: 43.7 % (ref 35.0–47.0)
HEMOGLOBIN: 14.5 g/dL (ref 12.0–16.0)
MCH: 29.4 pg (ref 26.0–34.0)
MCHC: 33.1 g/dL (ref 32.0–36.0)
MCV: 88.7 fL (ref 80.0–100.0)
Platelets: 288 10*3/uL (ref 150–440)
RBC: 4.92 MIL/uL (ref 3.80–5.20)
RDW: 17.8 % — ABNORMAL HIGH (ref 11.5–14.5)
WBC: 10.2 10*3/uL (ref 3.6–11.0)

## 2015-05-10 LAB — TROPONIN I

## 2015-05-10 MED ORDER — HYDRALAZINE HCL 25 MG PO TABS
25.0000 mg | ORAL_TABLET | Freq: Three times a day (TID) | ORAL | Status: DC
  Administered 2015-05-10 – 2015-05-12 (×6): 25 mg via ORAL
  Filled 2015-05-10 (×6): qty 1

## 2015-05-10 MED ORDER — IPRATROPIUM-ALBUTEROL 20-100 MCG/ACT IN AERS: 2.0000 | INHALATION_SPRAY | Freq: Four times a day (QID) | RESPIRATORY_TRACT | Status: DC

## 2015-05-10 MED ORDER — IPRATROPIUM-ALBUTEROL 0.5-2.5 (3) MG/3ML IN SOLN
3.0000 mL | Freq: Four times a day (QID) | RESPIRATORY_TRACT | Status: DC
  Administered 2015-05-10 – 2015-05-11 (×4): 3 mL via RESPIRATORY_TRACT
  Filled 2015-05-10 (×5): qty 3

## 2015-05-10 MED ORDER — ALPRAZOLAM 0.25 MG PO TABS
0.2500 mg | ORAL_TABLET | Freq: Three times a day (TID) | ORAL | Status: DC | PRN
  Administered 2015-05-10 – 2015-05-11 (×2): 0.25 mg via ORAL
  Filled 2015-05-10 (×2): qty 1

## 2015-05-10 MED ORDER — BUDESONIDE 0.5 MG/2ML IN SUSP
0.2500 mg | Freq: Two times a day (BID) | RESPIRATORY_TRACT | Status: DC
  Administered 2015-05-10 – 2015-05-12 (×4): 0.5 mg via RESPIRATORY_TRACT
  Filled 2015-05-10 (×5): qty 2

## 2015-05-10 MED ORDER — IPRATROPIUM-ALBUTEROL 20-100 MCG/ACT IN AERS
2.0000 | INHALATION_SPRAY | Freq: Four times a day (QID) | RESPIRATORY_TRACT | Status: DC
  Filled 2015-05-10: qty 4

## 2015-05-10 MED ORDER — LABETALOL HCL 5 MG/ML IV SOLN
10.0000 mg | Freq: Once | INTRAVENOUS | Status: AC
  Administered 2015-05-10: 10 mg via INTRAVENOUS
  Filled 2015-05-10: qty 4

## 2015-05-10 NOTE — Care Management (Signed)
Patient declines any offer of home health services.  Says she has had services before and they were not helpful.  Is agreeable to have CM obtain a front wheeled rolling walker. Will speak with patient's husband to determine if he is in agreement to discharge home without home health services.Heads up referral for walka[facility] to [facility]

## 2015-05-10 NOTE — Progress Notes (Signed)
Physical Therapy Treatment Patient Details Name: Brittany Booth MRN: [id number] DOB: 1928-10-18 Today's Date: 05/10/2015    History of Present Illness Pt is an 81 year old female who was admitted to the hospital for chest pain and respiratory distress. Pt has history of COPD, CHF, and HTN.     PT Comments    Pt expressed increased fatigue today, willing to participate in therapy after some encouragement. Pt performed supine LE therex with increased fatigue.  Observed and assisted pt to toilet with RW use maintaining G balance.  Pt then transferred to chair with good negotiation of obstacles.  Did require instruction of sequencing and safety with transfers with RW use.  Would continue to benefit from skilled PT to improve strength, balance, and endurance for improved functional mobility.  Follow Up Recommendations  Home health PT     Equipment Recommendations  Rolling walker with 5" wheels    Recommendations for Other Services       Precautions / Restrictions Restrictions Weight Bearing Restrictions: No    Mobility  Bed Mobility Overal bed mobility: Independent                Transfers Overall transfer level: Needs assistance Equipment used: Rolling walker (2 wheeled) Transfers: Sit to/from Stand Sit to Stand: Min guard         General transfer comment: instruction for sequencing and PHP for sit<>stand transfers  Ambulation/Gait Ambulation/Gait assistance: Modified independent (Device/Increase time) Ambulation Distance (Feet): 20 Feet Assistive device: Rolling walker (2 wheeled) Gait Pattern/deviations: Step-through pattern     General Gait Details: amb bed to toilet to chair with RW neogotiating around objects, slow cadence   Stairs            Wheelchair Mobility    Modified Rankin (Stroke Patients Only)       Balance Overall balance assessment: History of Falls                                  Cognition Arousal/Alertness:  Awake/alert Behavior During Therapy: WFL for tasks assessed/performed Overall Cognitive Status: Within Functional Limits for tasks assessed                      Exercises      General Comments        Pertinent Vitals/Pain Pain Assessment: 0-10 Pain Score: 4  Pain Location: sternal Pain Intervention(s): Limited activity within patient's tolerance    Home Living                      Prior Function            PT Goals (current goals can now be found in the care plan section) Acute Rehab PT Goals Patient Stated Goal: To walk better and breath better PT Goal Formulation: With patient Potential to Achieve Goals: Good    Frequency  Min 2X/week    PT Plan      Co-evaluation             End of Session Equipment Utilized During Treatment: Gait belt;Oxygen Activity Tolerance: Patient limited by fatigue;Patient tolerated treatment well Patient left: in chair;with call bell/phone within reach     Time: 1330-1355 PT Time Calculation (min) (ACUTE ONLY): 25 min  Charges:  $Therapeutic Exercise: 8-22 mins $Therapeutic Activity: 8-22 mins  G Codes:      Laureano Hetzer 05-23-2015, 2:02 PM  Stefania Goulart, PTA

## 2015-05-10 NOTE — Progress Notes (Signed)
[facility] - [place] at [facility]   PATIENT NAME: Brittany Booth    MR#:  [id number]  DATE OF BIRTH:  04/21/1928  SUBJECTIVE:  CHIEF COMPLAINT: shortness of breath and swelling   REVIEW OF SYSTEMS:   Constitutional: Negative for fever, chills, weight loss and malaise/fatigue.  HENT: Positive for hearing loss. Negative for ear discharge, ear pain, nosebleeds and tinnitus.   Uses hearing aids  Eyes: Positive for blurred vision. Negative for double vision and photophobia.  Respiratory: Positive for cough, sputum production, shortness of breath and wheezing. Negative for hemoptysis.  Cardiovascular: Positive for chest pain and leg swelling. Negative for palpitations and orthopnea.  Gastrointestinal: Positive for constipation. Negative for heartburn, nausea, vomiting, abdominal pain, diarrhea and melena.  Genitourinary: Negative for dysuria, urgency, frequency and hematuria.  Musculoskeletal: Negative for myalgias, back pain and neck pain.  Skin: Negative for rash.  Neurological: Negative for dizziness, tingling, tremors, sensory change, speech change, focal weakness and headaches.  Endo/Heme/Allergies: Does not bruise/bleed easily.  Psychiatric/Behavioral: Negative for depression.    ROS  DRUG ALLERGIES:   Allergies  Allergen Reactions  . Latex   . Sulfa Antibiotics     VITALS:  Blood pressure 187/75, pulse 101, temperature 98.1 F (36.7 C), temperature source Oral, resp. rate 18, height  (1.6 m), weight 50.894 kg (112 lb 3.2 oz), SpO2 98 %.  PHYSICAL EXAMINATION:   GENERAL: 81 y.o.-year-old patient lying in the bed with no acute distress.  EYES: Pupils equal, round, reactive to light and accommodation. No scleral icterus. Extraocular muscles intact.  HEENT: Head atraumatic, normocephalic. Oropharynx and nasopharynx clear.  NECK: Supple, no jugular venous distention. No thyroid enlargement, no tenderness.  LUNGS: Moving air  bilaterally, coarse wheezing and also crackles posteriorly mostly in the lower two thirds. Minimal use of accessory muscles on exertion noted. CARDIOVASCULAR: S1 and S2 normal and regular, 3/6 systolic murmur present. No rubs or gallops.  ABDOMEN: Soft, nontender, nondistended. Bowel sounds present. No organomegaly or mass.  EXTREMITIES: No cyanosis, or clubbing. 1+ pedal edema bilaterally noted.  NEUROLOGIC: Cranial nerves II through XII are intact. Muscle strength 5/5 in all extremities. Sensation intact. Gait not checked.  PSYCHIATRIC: The patient is alert and oriented x 3.  SKIN: No obvious rash, lesion, or ulcer.   Physical Exam LABORATORY PANEL:   CBC  Recent Labs Lab 05/10/15 0421  WBC 10.2  HGB 14.5  HCT 43.7  PLT 288   ------------------------------------------------------------------------------------------------------------------  Chemistries   Recent Labs Lab 05/09/15 1357 05/10/15 0421  NA 139 145  K 4.0 3.3*  CL 99* 97*  CO2 31 35*  GLUCOSE 160* 95  BUN 28* 28*  CREATININE 1.08* 1.07*  CALCIUM 9.1 9.6  AST 22  --   ALT 26  --   ALKPHOS 62  --   BILITOT 0.4  --    ------------------------------------------------------------------------------------------------------------------  Cardiac Enzymes  Recent Labs Lab 05/09/15 1928 05/10/15 0118  TROPONINI <0.03 <0.03   ------------------------------------------------------------------------------------------------------------------  RADIOLOGY:  Dg Chest 2 View  05/09/2015   CLINICAL DATA:  Cough and shortness of breath with chest pain.  EXAM: CHEST  2 VIEW  COMPARISON:  05/06/2015.  FINDINGS: Trachea is midline. Heart size normal. Lungs are clear. No pleural fluid. Osteopenia.  IMPRESSION: No acute findings.   Electronically Signed   By: Leanna Battles M.D.   On: 05/09/2015 13:30    ASSESSMENT AND PLAN:    #1 acute on chronic respiratory distress-secondary to CHF exacerbation and COPD  exacerbation. -stable telemetry and troponins.  -2-D echocardiogram has been reviewed- no clear evidence of heart failure.-Started on IV Lasix twice a day at this time. - appreciated cardiology help.  #2 COPD exacerbation-chronic steroid-dependent. Started on IV Solu-Medrol here, continue any inhalers and also on 2 units at this time. -Has chronic bronchiectasis with increased sputum production and low-grade fever 3 days ago. -Chest x-ray - no acute findings, on Levaquin at this time. -Sputum cultures ordered -Patient also on Singulair and Daliresp - I spoke to Dr. Carolynne Edouard on phone- he agreed.  #3 hypertension-restarted her home medications, IV hydralazine when necessary for elevated blood pressure, increased hydralazine dose from 10 to 25 mg TID.  #4 constipation-stool softeners and laxities have been added.  #5 hypothyroidism-on Synthroid  All the records are reviewed and case discussed with Care Management/Social Workerr. Management plans discussed with the patient, family and they are in agreement.  CODE STATUS: full  TOTAL TIME TAKING CARE OF THIS PATIENT: 35 minutes.   POSSIBLE D/C IN 1-2 DAYS, DEPENDING ON CLINICAL CONDITION.   Altamese Dilling M.D on 05/10/2015   Between 7am to 6pm - Pager - [phone]  After 6pm go to [email or web address] - password [email or web address] [facility]  [place] [facility]  Office  [phone]  CC: Primary care physician; Lyndon Code, MD

## 2015-05-10 NOTE — Progress Notes (Signed)
Pt's systolic BP has been high. 236/76 at this time. Pt was given Hydralazine IV PRN, did not help much. Orders received from Dr. Betti Cruz for Labetalol 10 mg IV push over the phone. Will continue to monitor.

## 2015-05-11 MED ORDER — ZOLPIDEM TARTRATE 5 MG PO TABS
5.0000 mg | ORAL_TABLET | Freq: Every day | ORAL | Status: DC
Start: 2015-05-11 — End: 2015-05-12
  Administered 2015-05-11: 5 mg via ORAL
  Filled 2015-05-11: qty 1

## 2015-05-11 MED ORDER — ALPRAZOLAM 0.25 MG PO TABS
0.2500 mg | ORAL_TABLET | Freq: Three times a day (TID) | ORAL | Status: DC
  Administered 2015-05-11 – 2015-05-12 (×3): 0.25 mg via ORAL
  Filled 2015-05-11 (×3): qty 1

## 2015-05-11 MED ORDER — POTASSIUM CHLORIDE CRYS ER 20 MEQ PO TBCR
20.0000 meq | EXTENDED_RELEASE_TABLET | Freq: Two times a day (BID) | ORAL | Status: DC
  Administered 2015-05-11 – 2015-05-12 (×3): 20 meq via ORAL
  Filled 2015-05-11 (×3): qty 1

## 2015-05-11 MED ORDER — PREDNISONE 50 MG PO TABS
60.0000 mg | ORAL_TABLET | Freq: Every day | ORAL | Status: DC
  Administered 2015-05-12: 60 mg via ORAL
  Filled 2015-05-11: qty 1

## 2015-05-11 MED ORDER — CLONIDINE HCL 0.3 MG/24HR TD PTWK
0.3000 mg | MEDICATED_PATCH | TRANSDERMAL | Status: DC
  Administered 2015-05-11: 0.3 mg via TRANSDERMAL
  Filled 2015-05-11: qty 1

## 2015-05-11 MED ORDER — FUROSEMIDE 10 MG/ML IJ SOLN
40.0000 mg | Freq: Two times a day (BID) | INTRAMUSCULAR | Status: DC
  Administered 2015-05-11 – 2015-05-12 (×2): 40 mg via INTRAVENOUS
  Filled 2015-05-11: qty 4

## 2015-05-11 MED ORDER — LABETALOL HCL 5 MG/ML IV SOLN
10.0000 mg | Freq: Once | INTRAVENOUS | Status: AC
  Administered 2015-05-11: 10 mg via INTRAVENOUS
  Filled 2015-05-11: qty 4

## 2015-05-11 MED ORDER — IPRATROPIUM-ALBUTEROL 0.5-2.5 (3) MG/3ML IN SOLN
RESPIRATORY_TRACT | Status: AC
  Filled 2015-05-11: qty 3

## 2015-05-11 MED ORDER — METOPROLOL TARTRATE 25 MG PO TABS
25.0000 mg | ORAL_TABLET | Freq: Two times a day (BID) | ORAL | Status: DC
  Administered 2015-05-11 – 2015-05-12 (×3): 25 mg via ORAL
  Filled 2015-05-11 (×3): qty 1

## 2015-05-11 NOTE — Progress Notes (Signed)
Pt's systolic BP has been high. PRN Hydralazine IV was administered earlier and BP went down to 154/61. Pt's BP is 184/65 at this time. Orders received from Dr. Betti Cruz for Labetalol 10 mg IV push over the phone.Will continue to monitor.

## 2015-05-11 NOTE — Progress Notes (Signed)
[facility] - [place] at [facility]   PATIENT NAME: Brittany Booth    MR#:  [id number]  DATE OF BIRTH:  12/01/1927  SUBJECTIVE:  CHIEF COMPLAINT: shortness of breath and swelling   Feels little better today. Said Spirometry helped.   REVIEW OF SYSTEMS:   Constitutional: Negative for fever, chills, weight loss and malaise/fatigue.  HENT: Positive for hearing loss. Negative for ear discharge, ear pain, nosebleeds and tinnitus.   Uses hearing aids  Eyes: Positive for blurred vision. Negative for double vision and photophobia.  Respiratory: Positive for cough, sputum production, shortness of breath and wheezing. Negative for hemoptysis.  Cardiovascular: Positive for chest pain and leg swelling. Negative for palpitations and orthopnea.  Gastrointestinal: Positive for constipation. Negative for heartburn, nausea, vomiting, abdominal pain, diarrhea and melena.  Genitourinary: Negative for dysuria, urgency, frequency and hematuria.  Musculoskeletal: Negative for myalgias, back pain and neck pain.  Skin: Negative for rash.  Neurological: Negative for dizziness, tingling, tremors, sensory change, speech change, focal weakness and headaches.  Endo/Heme/Allergies: Does not bruise/bleed easily.  Psychiatric/Behavioral: Negative for depression.    ROS  DRUG ALLERGIES:   Allergies  Allergen Reactions  . Latex   . Sulfa Antibiotics     VITALS:  Blood pressure 169/78, pulse 98, temperature 97.6 F (36.4 C), temperature source Oral, resp. rate 16, height 5\' 3"  (1.6 m), weight 50.894 kg (112 lb 3.2 oz), SpO2 96 %.  PHYSICAL EXAMINATION:   GENERAL: 81 y.o.-year-old patient lying in the bed with no acute distress.  EYES: Pupils equal, round, reactive to light and accommodation. No scleral icterus. Extraocular muscles intact.  HEENT: Head atraumatic, normocephalic. Oropharynx and nasopharynx clear.  NECK: Supple, no jugular venous distention. No thyroid  enlargement, no tenderness.  LUNGS: Moving air bilaterally, coarse wheezing and also crackles posteriorly mostly in the lower two thirds. Minimal use of accessory muscles on exertion noted. CARDIOVASCULAR: S1 and S2 normal and regular, 3/6 systolic murmur present. No rubs or gallops.  ABDOMEN: Soft, nontender, nondistended. Bowel sounds present. No organomegaly or mass.  EXTREMITIES: No cyanosis, or clubbing. 1+ pedal edema bilaterally noted.  NEUROLOGIC: Cranial nerves II through XII are intact. Muscle strength 5/5 in all extremities. Sensation intact. Gait not checked.  PSYCHIATRIC: The patient is alert and oriented x 3.  SKIN: No obvious rash, lesion, or ulcer.   Physical Exam LABORATORY PANEL:   CBC  Recent Labs Lab 05/10/15 0421  WBC 10.2  HGB 14.5  HCT 43.7  PLT 288   ------------------------------------------------------------------------------------------------------------------  Chemistries   Recent Labs Lab 05/09/15 1357 05/10/15 0421  NA 139 145  K 4.0 3.3*  CL 99* 97*  CO2 31 35*  GLUCOSE 160* 95  BUN 28* 28*  CREATININE 1.08* 1.07*  CALCIUM 9.1 9.6  AST 22  --   ALT 26  --   ALKPHOS 62  --   BILITOT 0.4  --    ------------------------------------------------------------------------------------------------------------------  Cardiac Enzymes  Recent Labs Lab 05/09/15 1928 05/10/15 0118  TROPONINI <0.03 <0.03   ------------------------------------------------------------------------------------------------------------------  RADIOLOGY:  No results found.  ASSESSMENT AND PLAN:    #1 acute on chronic respiratory distress-secondary to CHF exacerbation and COPD exacerbation. -stable telemetry and troponins.  -2-D echocardiogram has been reviewed- no clear evidence of heart failure.-Started on IV Lasix twice a day at this time. - appreciated cardiology help. Per them no CHF.  #2 COPD exacerbation-chronic steroid-dependent. Started on IV  Solu-Medrol here, continue any inhalers and also on 2 units at this time. -  Has chronic bronchiectasis with increased sputum production and low-grade fever 3 days ago. - Chest x-ray - no acute findings, on Levaquin at this time. -Sputum cultures ordered - Patient also on Singulair and Daliresp - I spoke to Dr. Carolynne Edouard on phone- he agreed. - encouraged her to use spirometry.  #3 Uncontrolled hypertension-restarted her home medications, IV hydralazine when necessary for elevated blood pressure, increased hydralazine dose from 10 to 25 mg TID.   Increased Clonidine patch and added metoprolol.  #4 constipation-stool softeners and laxities have been added.  #5 hypothyroidism-on Synthroid  All the records are reviewed and case discussed with Care Management/Social Workerr. Management plans discussed with the patient, family and they are in agreement.  CODE STATUS: full  TOTAL TIME TAKING CARE OF THIS PATIENT: 35 minutes.    POSSIBLE D/C IN 1-2 DAYS, DEPENDING ON CLINICAL CONDITION.   Altamese Dilling M.D on 05/11/2015   Between 7am to 6pm - Pager - [phone]  After 6pm go to [email or web address] - password [email or web address] [facility]  [place] [facility]  Office  [phone]  CC: Primary care physician; Lyndon Code, MD

## 2015-05-11 NOTE — Care Management (Signed)
Continues to decline home health services.  Will continue to try and persuade to accept

## 2015-05-11 NOTE — Therapy (Signed)
Called to patient's room by nurse to assess SOB. Patient stated she became SOB after getting up for bathroom and to dress. RR 28, HR 94, SpO2 95%.  Bilat breath sounds: expiratory wheezes with prolonged expiratory phase. Post tx, breath sounds with improved aeration, persistent wheezes. Patient stated she felt better but continues to be tachypnic, RR 26. Congested non-productive cough.

## 2015-05-12 LAB — BASIC METABOLIC PANEL
ANION GAP: 6 (ref 5–15)
BUN: 40 mg/dL — AB (ref 6–20)
CALCIUM: 8.8 mg/dL — AB (ref 8.9–10.3)
CO2: 35 mmol/L — ABNORMAL HIGH (ref 22–32)
CREATININE: 1.29 mg/dL — AB (ref 0.44–1.00)
Chloride: 99 mmol/L — ABNORMAL LOW (ref 101–111)
GFR calc Af Amer: 43 mL/min — ABNORMAL LOW (ref >60)
GFR calc non Af Amer: 37 mL/min — ABNORMAL LOW (ref >60)
Glucose, Bld: 112 mg/dL — ABNORMAL HIGH (ref 65–99)
Potassium: 3.4 mmol/L — ABNORMAL LOW (ref 3.5–5.1)
Sodium: 140 mmol/L (ref 135–145)

## 2015-05-12 MED ORDER — BUDESONIDE 90 MCG/ACT IN AEPB: 1.0000 | INHALATION_SPRAY | Freq: Two times a day (BID) | RESPIRATORY_TRACT | Status: AC

## 2015-05-12 MED ORDER — TRAMADOL HCL 50 MG PO TABS: 50.0000 mg | ORAL_TABLET | Freq: Four times a day (QID) | ORAL | Status: AC | PRN

## 2015-05-12 MED ORDER — MONTELUKAST SODIUM 10 MG PO TABS: 10.0000 mg | ORAL_TABLET | Freq: Every day | ORAL | Status: AC

## 2015-05-12 MED ORDER — LEVOFLOXACIN 250 MG PO TABS: 250.0000 mg | ORAL_TABLET | Freq: Every day | ORAL | Status: AC

## 2015-05-12 MED ORDER — METOPROLOL TARTRATE 25 MG PO TABS: 25.0000 mg | ORAL_TABLET | Freq: Two times a day (BID) | ORAL | Status: AC

## 2015-05-12 MED ORDER — HYDRALAZINE HCL 25 MG PO TABS: 25.0000 mg | ORAL_TABLET | Freq: Three times a day (TID) | ORAL | Status: AC

## 2015-05-12 MED ORDER — ROFLUMILAST 500 MCG PO TABS: 500.0000 ug | ORAL_TABLET | Freq: Every day | ORAL | Status: AC

## 2015-05-12 MED ORDER — FUROSEMIDE 20 MG PO TABS: 20.0000 mg | ORAL_TABLET | Freq: Every day | ORAL | Status: AC

## 2015-05-12 MED ORDER — MIRTAZAPINE 15 MG PO TABS: 15.0000 mg | ORAL_TABLET | Freq: Every day | ORAL | Status: AC

## 2015-05-12 MED ORDER — CLONIDINE HCL 0.3 MG/24HR TD PTWK: 0.3000 mg | MEDICATED_PATCH | TRANSDERMAL | Status: AC

## 2015-05-12 MED ORDER — PREDNISONE 10 MG (21) PO TBPK: 10.0000 mg | ORAL_TABLET | Freq: Every day | ORAL | Status: AC

## 2015-05-12 MED ORDER — POTASSIUM CHLORIDE CRYS ER 20 MEQ PO TBCR: 20.0000 meq | EXTENDED_RELEASE_TABLET | Freq: Every day | ORAL | Status: AC

## 2015-05-12 NOTE — Care Management (Signed)
Today patient says she might agree to home health services.  Asks that CM speak with her husband

## 2015-05-12 NOTE — Progress Notes (Signed)
Pt to be discharged today. Iv and tele removed. disch instructions and prescrips given to pt's husband. disch via w.c. To home with h.h.

## 2015-05-12 NOTE — Discharge Instructions (Signed)
Follow with PMD in 1-2 weeks Follow with pulmonologist in 1 week.

## 2015-05-12 NOTE — Care Management (Signed)
Patient is to discharge home today.  Has received the rolling walker.  Is agreeable to home health nurse.  She declines physical therapy service because she does not like to exercise but she and her husband very much in agreement with home health nurse.  Ag[facility] preference is [facility] and  Requests Grant Ruts as her nurse

## 2015-05-12 NOTE — Discharge Summary (Addendum)
[facility] -  at [facility]   PATIENT NAME: Brittany Booth    MR#:  [id number]  DATE OF BIRTH:  1928/11/03  DATE OF ADMISSION:  05/09/2015 ADMITTING PHYSICIAN: Enid Baas, MD  DATE OF DISCHARGE:05/12/2015  PRIMARY CARE PHYSICIAN: Lyndon Code, MD    ADMISSION DIAGNOSIS:  Acute Respiratory Failure  Hypoxia  DISCHARGE DIAGNOSIS:  Principal Problem:   Acute respiratory distress Active Problems:   Acute exacerbation of CHF (congestive heart failure)   CHF (congestive heart failure)    COPD exaceerbation  SECONDARY DIAGNOSIS:   Past Medical History  Diagnosis Date  . Anxiety   . COPD (chronic obstructive pulmonary disease)   . Depression   . Hypertension   . Hyperlipidemia   . Thyroid disease     Hypothyroidism  . Arthritis   . Renal artery stenosis     a. s/p stenting  . Secondary renovascular hypertension, unspecified   . Chronic respiratory failure     a. on 2.5L O2 at home via nasal cannula  . Diastolic dysfunction     a. echo 04/2015: EF 55-60%, no RWMA, GR1DD, calcified mitral annulus, PASP 35 mm Hg    HOSPITAL COURSE:   #1 acute on chronic respiratory distress-secondary to CHF exacerbation and COPD exacerbation. -stable telemetry and troponins.  -2-D echocardiogram has been reviewed- no clear evidence of heart failure.-Started on IV Lasix twice a day at this time. - appreciated cardiology help. Per them no CHF.  #2 COPD exacerbation-chronic steroid-dependent. Started on IV Solu-Medrol here, continue any inhalers and also on 2 units at this time. - Has chronic bronchiectasis with increased sputum production and low-grade fever 3 days ago. - Chest x-ray - no acute findings, on Levaquin at this time. -Sputum cultures ordered - Patient also on Singulair and Daliresp - I spoke to Dr. Carolynne Edouard on phone- he agreed. - encouraged her to use spirometry.  #3 Uncontrolled hypertension-restarted her home medications, IV  hydralazine when necessary for elevated blood pressure, increased hydralazine dose from 10 to 25 mg TID.  Increased Clonidine patch and added metoprolol.  #4 constipation-stool softeners and laxities have been added.  #5 hypothyroidism-on Synthroid   DISCHARGE CONDITIONS:   Stable.  CONSULTS OBTAINED:  Treatment Team:  Iran Ouch, MD  DRUG ALLERGIES:   Allergies  Allergen Reactions  . Latex   . Sulfa Antibiotics     DISCHARGE MEDICATIONS:   Current Discharge Medication List    START taking these medications   Details  Budesonide 90 MCG/ACT inhaler Inhale 1 puff into the lungs 2 (two) times daily. Qty: 2 Inhaler, Refills: 0    cloNIDine (CATAPRES-TTS-3) 0.3 mg/24hr patch Place 1 patch (0.3 mg total) onto the skin once a week. Qty: 4 patch, Refills: 12    levofloxacin (LEVAQUIN) 250 MG tablet Take 1 tablet (250 mg total) by mouth daily. Qty: 3 tablet, Refills: 0    metoprolol tartrate (LOPRESSOR) 25 MG tablet Take 1 tablet (25 mg total) by mouth 2 (two) times daily. Qty: 60 tablet, Refills: 0    potassium chloride SA (K-DUR,KLOR-CON) 20 MEQ tablet Take 1 tablet (20 mEq total) by mouth daily. Qty: 30 tablet, Refills: 0    predniSONE (STERAPRED UNI-PAK 21 TAB) 10 MG (21) TBPK tablet Take 1 tablet (10 mg total) by mouth daily. Start at 60 mg on first day, taper 10 mg daily until complete. Qty: 21 tablet, Refills: 0      CONTINUE these medications which have CHANGED  Details  furosemide (LASIX) 20 MG tablet Take 1 tablet (20 mg total) by mouth daily. Qty: 30 tablet, Refills: 0    hydrALAZINE (APRESOLINE) 25 MG tablet Take 1 tablet (25 mg total) by mouth every 8 (eight) hours. Qty: 90 tablet, Refills: 0    mirtazapine (REMERON) 15 MG tablet Take 1 tablet (15 mg total) by mouth at bedtime. Qty: 30 tablet, Refills: 0    montelukast (SINGULAIR) 10 MG tablet Take 1 tablet (10 mg total) by mouth at bedtime. Qty: 30 tablet, Refills: 0    roflumilast  (DALIRESP) 500 MCG TABS tablet Take 1 tablet (500 mcg total) by mouth daily. Qty: 30 tablet, Refills: 0    traMADol (ULTRAM) 50 MG tablet Take 1 tablet (50 mg total) by mouth every 6 (six) hours as needed. Qty: 30 tablet, Refills: 0      CONTINUE these medications which have NOT CHANGED   Details  ALPRAZolam (XANAX) 0.25 MG tablet Take 0.25 mg by mouth at bedtime as needed for sleep.    CARDIZEM CD 300 MG 24 hr capsule     fexofenadine (ALLEGRA) 180 MG tablet Take 180 mg by mouth daily.    Ipratropium-Albuterol (COMBIVENT RESPIMAT) 20-100 MCG/ACT AERS respimat Inhale 1 puff into the lungs every 6 (six) hours.    levalbuterol (XOPENEX) 1.25 MG/3ML nebulizer solution Take 1.25 mg by nebulization every 4 (four) hours as needed for wheezing.    levothyroxine (SYNTHROID, LEVOTHROID) 112 MCG tablet Take 112 mcg by mouth daily before breakfast.    OXYGEN-HELIUM IN Inhale 2 L into the lungs continuous.    tiotropium (SPIRIVA) 18 MCG inhalation capsule Place 18 mcg into inhaler and inhale daily.    zolpidem (AMBIEN) 10 MG tablet Take 10 mg by mouth at bedtime as needed for sleep.      STOP taking these medications     predniSONE (DELTASONE) 5 MG tablet      diltiazem (CARDIZEM LA) 300 MG 24 hr tablet          DISCHARGE INSTRUCTIONS:    Follow with Dr. Carolynne Edouard in 1 week.  If you experience worsening of your admission symptoms, develop shortness of breath, life threatening emergency, suicidal or homicidal thoughts you must seek medical attention immediately by calling 911 or calling your MD immediately  if symptoms less severe.  You Must read complete instructions/literature along with all the possible adverse reactions/side effects for all the Medicines you take and that have been prescribed to you. Take any new Medicines after you have completely understood and accept all the possible adverse reactions/side effects.   Please note  You were cared for by a hospitalist during  your hospital stay. If you have any questions about your discharge medications or the care you received while you were in the hospital after you are discharged, you can call the unit and asked to speak with the hospitalist on call if the hospitalist that took care of you is not available. Once you are discharged, your primary care physician will handle any further medical issues. Please note that NO REFILLS for any discharge medications will be authorized once you are discharged, as it is imperative that you return to your primary care physician (or establish a relationship with a primary care physician if you do not have one) for your aftercare needs so that they can reassess your need for medications and monitor your lab values.    Today   CHIEF COMPLAINT:  Feels better.  HISTORY OF PRESENT ILLNESS:  Cadie Meiss  is a 81 y.o. female with a known history of chronic respiratory failure secondary to COPD on 2 L home oxygen, diastolic CHF, hypertension and arthritis presents to the hospital from pulmonologist office secondary to acute respiratory distress and anasarca. Patient has been having worsening dyspnea than baseline, worsening cough with mucoid and yellowish phlegm. She presented to the ER 3 days ago, and was discharged on a prednisone taper and DuoNeb's. She continues to continued to get worse and also having worsening pedal edema and presented to Dr. Milta Deiters office. He felt that she was in CHF exacerbation and sent her for direct admission. She denies any worse or chills. She has chronic bronchiectasis. She is also having midsternal chest pain from constant coughing.   VITAL SIGNS:  Blood pressure 145/57, pulse 71, temperature 98 F (36.7 C), temperature source Oral, resp. rate 18, height 5\' 3"  (1.6 m), weight 49.805 kg (109 lb 12.8 oz), SpO2 98 %.  I/O:    Intake/Output Summary (Last 24 hours) at 05/12/15 1028 Last data filed at 05/12/15 0948  Gross per 24 hour  Intake    580 ml   Output   1600 ml  Net  -1020 ml    PHYSICAL EXAMINATION:  GENERAL: 81 y.o.-year-old patient lying in the bed with no acute distress.  EYES: Pupils equal, round, reactive to light and accommodation. No scleral icterus. Extraocular muscles intact.  HEENT: Head atraumatic, normocephalic. Oropharynx and nasopharynx clear.  NECK: Supple, no jugular venous distention. No thyroid enlargement, no tenderness.  LUNGS: Moving air bilaterally, coarse wheezing and also crackles posteriorly mostly in the lower two thirds. Minimal use of accessory muscles on exertion noted. CARDIOVASCULAR: S1 and S2 normal and regular, 3/6 systolic murmur present. No rubs or gallops.  ABDOMEN: Soft, nontender, nondistended. Bowel sounds present. No organomegaly or mass.  EXTREMITIES: No cyanosis, or clubbing. 1+ pedal edema bilaterally noted.  NEUROLOGIC: Cranial nerves II through XII are intact. Muscle strength 5/5 in all extremities. Sensation intact. Gait not checked.  PSYCHIATRIC: The patient is alert and oriented x 3.  SKIN: No obvious rash, lesion, or ulcer.   DATA REVIEW:   CBC  Recent Labs Lab 05/10/15 0421  WBC 10.2  HGB 14.5  HCT 43.7  PLT 288    Chemistries   Recent Labs Lab 05/09/15 1357  05/12/15 0326  NA 139  < > 140  K 4.0  < > 3.4*  CL 99*  < > 99*  CO2 31  < > 35*  GLUCOSE 160*  < > 112*  BUN 28*  < > 40*  CREATININE 1.08*  < > 1.29*  CALCIUM 9.1  < > 8.8*  AST 22  --   --   ALT 26  --   --   ALKPHOS 62  --   --   BILITOT 0.4  --   --   < > = values in this interval not displayed.  Cardiac Enzymes  Recent Labs Lab 05/10/15 0118  TROPONINI <0.03    Microbiology Results  Results for orders placed or performed during the hospital encounter of 05/09/15  Culture, expectorated sputum-assessment     Status: None (Preliminary result)   Collection Time: 05/11/15  2:30 AM  Result Value Ref Range Status   Specimen Description SPU  Final   Special Requests Normal   Final   Sputum evaluation   Final    Sputum specimen not acceptable for testing.  Please recollect.   NOTIFIED Filiberto Pinks @  0602 6.15.16 MPG   Report Status PENDING  Incomplete      Management plans discussed with the patient, family and they are in agreement.  CODE STATUS:     Code Status Orders        Start     Ordered   05/09/15 1235  Full code   Continuous     05/09/15 1235    Advance Directive Documentation        Most Recent Value   Type of Advance Directive  Living will   Pre-existing out of facility DNR order (yellow form or pink MOST form)     "MOST" Form in Place?        TOTAL TIME TAKING CARE OF THIS PATIENT: 35 minutes.    Altamese Dilling M.D on 05/12/2015 at 10:28 AM  Between 7am to 6pm - Pager - [phone]  After 6pm go to [email or web address] - password [email or web address] [facility]  [facility] [place] [facility]  Office  [phone]  CC: Primary care physician; Lyndon Code, MD

## 2015-05-23 LAB — EXPECTORATED SPUTUM ASSESSMENT W REFEX TO RESP CULTURE

## 2015-05-23 LAB — EXPECTORATED SPUTUM ASSESSMENT W GRAM STAIN, RFLX TO RESP C: Special Requests: NORMAL

## 2015-08-02 ENCOUNTER — Encounter: Payer: Self-pay | Admitting: Emergency Medicine

## 2015-08-02 ENCOUNTER — Emergency Department: Payer: Medicare Other

## 2015-08-02 ENCOUNTER — Inpatient Hospital Stay
Admission: EM | Admit: 2015-08-02 | Discharge: 2015-08-04 | DRG: 189 | Disposition: A | Discharge status: Hospice | Payer: Medicare Other | Attending: Internal Medicine | Admitting: Internal Medicine

## 2015-08-02 DIAGNOSIS — R911 Solitary pulmonary nodule: Secondary | ICD-10-CM | POA: Diagnosis present

## 2015-08-02 DIAGNOSIS — F419 Anxiety disorder, unspecified: Secondary | ICD-10-CM | POA: Diagnosis present

## 2015-08-02 DIAGNOSIS — Z87891 Personal history of nicotine dependence: Secondary | ICD-10-CM

## 2015-08-02 DIAGNOSIS — J441 Chronic obstructive pulmonary disease with (acute) exacerbation: Secondary | ICD-10-CM | POA: Diagnosis not present

## 2015-08-02 DIAGNOSIS — Z79891 Long term (current) use of opiate analgesic: Secondary | ICD-10-CM | POA: Diagnosis not present

## 2015-08-02 DIAGNOSIS — I129 Hypertensive chronic kidney disease with stage 1 through stage 4 chronic kidney disease, or unspecified chronic kidney disease: Secondary | ICD-10-CM | POA: Diagnosis present

## 2015-08-02 DIAGNOSIS — E039 Hypothyroidism, unspecified: Secondary | ICD-10-CM | POA: Diagnosis present

## 2015-08-02 DIAGNOSIS — J9601 Acute respiratory failure with hypoxia: Secondary | ICD-10-CM

## 2015-08-02 DIAGNOSIS — Z8249 Family history of ischemic heart disease and other diseases of the circulatory system: Secondary | ICD-10-CM | POA: Diagnosis not present

## 2015-08-02 DIAGNOSIS — Z9889 Other specified postprocedural states: Secondary | ICD-10-CM | POA: Diagnosis not present

## 2015-08-02 DIAGNOSIS — J9621 Acute and chronic respiratory failure with hypoxia: Secondary | ICD-10-CM | POA: Diagnosis present

## 2015-08-02 DIAGNOSIS — N183 Chronic kidney disease, stage 3 (moderate): Secondary | ICD-10-CM | POA: Diagnosis present

## 2015-08-02 DIAGNOSIS — Z79899 Other long term (current) drug therapy: Secondary | ICD-10-CM

## 2015-08-02 DIAGNOSIS — Z9049 Acquired absence of other specified parts of digestive tract: Secondary | ICD-10-CM | POA: Diagnosis present

## 2015-08-02 DIAGNOSIS — Z66 Do not resuscitate: Secondary | ICD-10-CM | POA: Diagnosis present

## 2015-08-02 DIAGNOSIS — Z515 Encounter for palliative care: Secondary | ICD-10-CM | POA: Diagnosis not present

## 2015-08-02 DIAGNOSIS — R0602 Shortness of breath: Secondary | ICD-10-CM

## 2015-08-02 DIAGNOSIS — Z9981 Dependence on supplemental oxygen: Secondary | ICD-10-CM

## 2015-08-02 HISTORY — DX: Disorder of kidney and ureter, unspecified: N28.9

## 2015-08-02 LAB — COMPREHENSIVE METABOLIC PANEL
ALT: 24 U/L (ref 14–54)
AST: 22 U/L (ref 15–41)
Albumin: 3.4 g/dL — ABNORMAL LOW (ref 3.5–5.0)
Alkaline Phosphatase: 89 U/L (ref 38–126)
Anion gap: 10 (ref 5–15)
BUN: 18 mg/dL (ref 6–20)
CHLORIDE: 96 mmol/L — AB (ref 101–111)
CO2: 34 mmol/L — ABNORMAL HIGH (ref 22–32)
CREATININE: 0.97 mg/dL (ref 0.44–1.00)
Calcium: 8.9 mg/dL (ref 8.9–10.3)
GFR, EST NON AFRICAN AMERICAN: 52 mL/min — AB (ref >60)
Glucose, Bld: 67 mg/dL (ref 65–99)
POTASSIUM: 3.6 mmol/L (ref 3.5–5.1)
Sodium: 140 mmol/L (ref 135–145)
Total Bilirubin: 0.6 mg/dL (ref 0.3–1.2)
Total Protein: 5.9 g/dL — ABNORMAL LOW (ref 6.5–8.1)

## 2015-08-02 LAB — TROPONIN I
TROPONIN I: 0.03 ng/mL (ref <0.031)
Troponin I: 0.03 ng/mL (ref <0.031)
Troponin I: <0.03 ng/mL (ref <0.031)

## 2015-08-02 LAB — CBC
HCT: 37 % (ref 35.0–47.0)
Hemoglobin: 12.2 g/dL (ref 12.0–16.0)
MCH: 30 pg (ref 26.0–34.0)
MCHC: 32.9 g/dL (ref 32.0–36.0)
MCV: 91.2 fL (ref 80.0–100.0)
PLATELETS: 276 10*3/uL (ref 150–440)
RBC: 4.05 MIL/uL (ref 3.80–5.20)
RDW: 16 % — ABNORMAL HIGH (ref 11.5–14.5)
WBC: 11.7 10*3/uL — ABNORMAL HIGH (ref 3.6–11.0)

## 2015-08-02 MED ORDER — TIOTROPIUM BROMIDE MONOHYDRATE 18 MCG IN CAPS
18.0000 ug | ORAL_CAPSULE | Freq: Every day | RESPIRATORY_TRACT | Status: DC
  Administered 2015-08-03: 18 ug via RESPIRATORY_TRACT
  Filled 2015-08-02: qty 5

## 2015-08-02 MED ORDER — CLONIDINE HCL 0.1 MG PO TABS
0.3000 mg | ORAL_TABLET | Freq: Once | ORAL | Status: AC
Start: 2015-08-02 — End: 2015-08-02
  Administered 2015-08-02: 0.3 mg via ORAL
  Filled 2015-08-02: qty 3

## 2015-08-02 MED ORDER — ENOXAPARIN SODIUM 40 MG/0.4ML ~~LOC~~ SOLN
40.0000 mg | SUBCUTANEOUS | Status: DC
  Administered 2015-08-02 – 2015-08-03 (×2): 40 mg via SUBCUTANEOUS
  Filled 2015-08-02 (×3): qty 0.4

## 2015-08-02 MED ORDER — DILTIAZEM HCL 60 MG PO TABS
60.0000 mg | ORAL_TABLET | Freq: Once | ORAL | Status: AC
Start: 2015-08-02 — End: 2015-08-02
  Administered 2015-08-02: 60 mg via ORAL
  Filled 2015-08-02: qty 1

## 2015-08-02 MED ORDER — DOXYCYCLINE HYCLATE 100 MG PO TABS
100.0000 mg | ORAL_TABLET | Freq: Once | ORAL | Status: AC
  Administered 2015-08-02: 100 mg via ORAL
  Filled 2015-08-02: qty 1

## 2015-08-02 MED ORDER — OXYCODONE HCL 5 MG PO TABS: 5.0000 mg | ORAL_TABLET | Freq: Three times a day (TID) | ORAL | Status: DC | PRN

## 2015-08-02 MED ORDER — METHYLPREDNISOLONE SODIUM SUCC 125 MG IJ SOLR
60.0000 mg | Freq: Four times a day (QID) | INTRAMUSCULAR | Status: DC
  Administered 2015-08-02 – 2015-08-04 (×7): 60 mg via INTRAVENOUS
  Filled 2015-08-02 (×8): qty 2

## 2015-08-02 MED ORDER — CLONIDINE HCL 0.1 MG PO TABS
0.2000 mg | ORAL_TABLET | Freq: Two times a day (BID) | ORAL | Status: DC
  Administered 2015-08-02 – 2015-08-04 (×4): 0.2 mg via ORAL
  Filled 2015-08-02 (×4): qty 2

## 2015-08-02 MED ORDER — SODIUM CHLORIDE 0.9 % IJ SOLN
3.0000 mL | Freq: Two times a day (BID) | INTRAMUSCULAR | Status: DC
  Administered 2015-08-02 – 2015-08-04 (×5): 3 mL via INTRAVENOUS

## 2015-08-02 MED ORDER — METHYLPREDNISOLONE SODIUM SUCC 125 MG IJ SOLR
60.0000 mg | Freq: Once | INTRAMUSCULAR | Status: AC
  Administered 2015-08-02: 60 mg via INTRAVENOUS
  Filled 2015-08-02: qty 2

## 2015-08-02 MED ORDER — DILTIAZEM HCL 25 MG/5ML IV SOLN
INTRAVENOUS | Status: AC
  Administered 2015-08-02: 20 mg via INTRAVENOUS
  Filled 2015-08-02: qty 5

## 2015-08-02 MED ORDER — ACETAMINOPHEN 650 MG RE SUPP: 650.0000 mg | Freq: Four times a day (QID) | RECTAL | Status: DC | PRN

## 2015-08-02 MED ORDER — MONTELUKAST SODIUM 10 MG PO TABS
10.0000 mg | ORAL_TABLET | Freq: Every day | ORAL | Status: DC
  Administered 2015-08-02 – 2015-08-03 (×2): 10 mg via ORAL
  Filled 2015-08-02 (×2): qty 1

## 2015-08-02 MED ORDER — CETYLPYRIDINIUM CHLORIDE 0.05 % MT LIQD
7.0000 mL | Freq: Two times a day (BID) | OROMUCOSAL | Status: DC
  Administered 2015-08-02 – 2015-08-03 (×3): 7 mL via OROMUCOSAL

## 2015-08-02 MED ORDER — ALBUTEROL SULFATE (2.5 MG/3ML) 0.083% IN NEBU: 5.0000 mg | INHALATION_SOLUTION | Freq: Once | RESPIRATORY_TRACT | Status: DC

## 2015-08-02 MED ORDER — SENNOSIDES-DOCUSATE SODIUM 8.6-50 MG PO TABS: 1.0000 | ORAL_TABLET | Freq: Two times a day (BID) | ORAL | Status: DC | PRN

## 2015-08-02 MED ORDER — POTASSIUM CHLORIDE CRYS ER 20 MEQ PO TBCR
20.0000 meq | EXTENDED_RELEASE_TABLET | Freq: Every day | ORAL | Status: DC
  Administered 2015-08-03 – 2015-08-04 (×2): 20 meq via ORAL
  Filled 2015-08-02 (×2): qty 1

## 2015-08-02 MED ORDER — DILTIAZEM HCL 25 MG/5ML IV SOLN
20.0000 mg | Freq: Once | INTRAVENOUS | Status: AC
  Administered 2015-08-02: 20 mg via INTRAVENOUS

## 2015-08-02 MED ORDER — MIRTAZAPINE 15 MG PO TABS
15.0000 mg | ORAL_TABLET | Freq: Every day | ORAL | Status: DC
  Administered 2015-08-02 – 2015-08-03 (×2): 15 mg via ORAL
  Filled 2015-08-02 (×2): qty 1

## 2015-08-02 MED ORDER — ALPRAZOLAM 0.25 MG PO TABS
0.2500 mg | ORAL_TABLET | Freq: Every day | ORAL | Status: DC | PRN
  Administered 2015-08-03: 0.25 mg via ORAL
  Filled 2015-08-02: qty 1

## 2015-08-02 MED ORDER — ACETAMINOPHEN 325 MG PO TABS: 650.0000 mg | ORAL_TABLET | Freq: Four times a day (QID) | ORAL | Status: DC | PRN

## 2015-08-02 MED ORDER — TIOTROPIUM BROMIDE MONOHYDRATE 18 MCG IN CAPS
18.0000 ug | ORAL_CAPSULE | Freq: Every day | RESPIRATORY_TRACT | Status: DC
  Filled 2015-08-02: qty 5

## 2015-08-02 MED ORDER — METOPROLOL TARTRATE 25 MG PO TABS
25.0000 mg | ORAL_TABLET | Freq: Two times a day (BID) | ORAL | Status: DC
  Administered 2015-08-02 – 2015-08-04 (×4): 25 mg via ORAL
  Filled 2015-08-02 (×4): qty 1

## 2015-08-02 MED ORDER — ALBUTEROL SULFATE (2.5 MG/3ML) 0.083% IN NEBU
2.5000 mg | INHALATION_SOLUTION | Freq: Four times a day (QID) | RESPIRATORY_TRACT | Status: DC
  Administered 2015-08-02 – 2015-08-04 (×8): 2.5 mg via RESPIRATORY_TRACT
  Filled 2015-08-02 (×9): qty 3

## 2015-08-02 MED ORDER — MORPHINE SULFATE (CONCENTRATE) 10 MG/0.5ML PO SOLN
6.2500 mg | ORAL | Status: DC | PRN
  Administered 2015-08-04: 6.25 mg via ORAL
  Filled 2015-08-02: qty 1

## 2015-08-02 MED ORDER — DOXYCYCLINE HYCLATE 100 MG PO TABS
100.0000 mg | ORAL_TABLET | Freq: Two times a day (BID) | ORAL | Status: DC
  Administered 2015-08-02 – 2015-08-04 (×4): 100 mg via ORAL
  Filled 2015-08-02 (×4): qty 1

## 2015-08-02 MED ORDER — IPRATROPIUM-ALBUTEROL 0.5-2.5 (3) MG/3ML IN SOLN
3.0000 mL | Freq: Once | RESPIRATORY_TRACT | Status: AC
  Administered 2015-08-02: 3 mL via RESPIRATORY_TRACT
  Filled 2015-08-02: qty 3

## 2015-08-02 MED ORDER — ROFLUMILAST 500 MCG PO TABS
500.0000 ug | ORAL_TABLET | Freq: Every day | ORAL | Status: DC
  Administered 2015-08-03 – 2015-08-04 (×2): 500 ug via ORAL
  Filled 2015-08-02 (×2): qty 1

## 2015-08-02 MED ORDER — LEVOTHYROXINE SODIUM 88 MCG PO TABS
88.0000 ug | ORAL_TABLET | Freq: Every day | ORAL | Status: DC
  Administered 2015-08-03 – 2015-08-04 (×2): 88 ug via ORAL
  Filled 2015-08-02 (×2): qty 1

## 2015-08-02 MED ORDER — DILTIAZEM HCL ER COATED BEADS 180 MG PO CP24
360.0000 mg | ORAL_CAPSULE | Freq: Every day | ORAL | Status: DC
  Administered 2015-08-03 – 2015-08-04 (×2): 360 mg via ORAL
  Filled 2015-08-02 (×2): qty 2

## 2015-08-02 MED ORDER — ZOLPIDEM TARTRATE 5 MG PO TABS
10.0000 mg | ORAL_TABLET | Freq: Every evening | ORAL | Status: DC | PRN
  Administered 2015-08-02 – 2015-08-03 (×2): 10 mg via ORAL
  Filled 2015-08-02 (×2): qty 2

## 2015-08-02 NOTE — ED Provider Notes (Signed)
[facility] Emergency Department Provider Note  ____________________________________________  Time seen: 10:35 AM  I have reviewed the triage vital signs and the nursing notes.   HISTORY  Chief Complaint Shortness of Breath     HPI Brittany Booth is a 81 y.o. female with a notable history of COPD. She uses supplemental oxygen at home 24 hours a day. She presents today with worsening shortness of breath that has been occurring over the past 2-3 days. She is in moderate and significant respiratory distress with increased work of breathing. She reports some chest tightness without short pain. She has no nausea or vomiting. She is alert and communicative at this time.  The patient is on prednisone, 10 mg twice a day area she has been on this for some time.  Past Medical History  Diagnosis Date  . COPD (chronic obstructive pulmonary disease)   . Hypertension   . Renal disorder     There are no active problems to display for this patient.   Past Surgical History  Procedure Laterality Date  . Appendectomy      Current Outpatient Rx  Name  Route  Sig  Dispense  Refill  . ALPRAZolam (XANAX) 0.25 MG tablet   Oral   Take 0.25 mg by mouth daily as needed for anxiety.         . cloNIDine (CATAPRES - DOSED IN MG/24 HR) 0.3 mg/24hr patch   Transdermal   Place 0.3 mg onto the skin once a week.         . diltiazem (TIAZAC) 360 MG 24 hr capsule   Oral   Take 360 mg by mouth daily.         Marland Kitchen levalbuterol (XOPENEX) 1.25 MG/3ML nebulizer solution   Nebulization   Take 1.25 mg by nebulization every 8 (eight) hours as needed for wheezing.         Marland Kitchen levothyroxine (SYNTHROID, LEVOTHROID) 88 MCG tablet   Oral   Take 88 mcg by mouth daily before breakfast.         . metoprolol tartrate (LOPRESSOR) 25 MG tablet   Oral   Take 25 mg by mouth 2 (two) times daily.         . mirtazapine (REMERON) 15 MG tablet   Oral   Take 15 mg by mouth at bedtime.          . montelukast (SINGULAIR) 10 MG tablet   Oral   Take 10 mg by mouth at bedtime.         . Morphine Sulfate (MORPHINE CONCENTRATE) 10 mg / 0.5 ml concentrated solution   Oral   Take 6.25-10 mg by mouth every 2 (two) hours as needed for severe pain. Morphine Sulfate 100mg /49ml: Take 0.51ml-0.50ml every 2 hours as needed for pain or dyspnea.         Marland Kitchen oxyCODONE (OXY IR/ROXICODONE) 5 MG immediate release tablet   Oral   Take 5 mg by mouth every 8 (eight) hours as needed for severe pain.         . potassium chloride SA (K-DUR,KLOR-CON) 20 MEQ tablet   Oral   Take 20 mEq by mouth daily.         . roflumilast (DALIRESP) 500 MCG TABS tablet   Oral   Take 500 mcg by mouth daily.         Marland Kitchen tiotropium (SPIRIVA) 18 MCG inhalation capsule   Inhalation   Place 18 mcg into inhaler and inhale daily.         Marland Kitchen  traMADol (ULTRAM) 50 MG tablet   Oral   Take 50 mg by mouth 3 (three) times daily.         Marland Kitchen zolpidem (AMBIEN) 10 MG tablet   Oral   Take 10 mg by mouth at bedtime as needed for sleep.           Allergies Review of patient's allergies indicates no known allergies.  No family history on file.  Social History Social History  Substance Use Topics  . Smoking status: Former Smoker    Quit date: 12/01/2004  . Smokeless tobacco: None  . Alcohol Use: No    Review of Systems  Constitutional: Negative for fever. ENT: Negative for sore throat. Cardiovascular: Negative for chest pain. Respiratory: Positive for shortness of breath. See history of present illness Gastrointestinal: Negative for abdominal pain, vomiting and diarrhea. Genitourinary: Negative for dysuria. Musculoskeletal: No myalgias or injuries. Skin: Negative for rash. Neurological: Negative for headaches   10-point ROS otherwise negative.  ____________________________________________   PHYSICAL EXAM:  VITAL SIGNS: ED Triage Vitals  Enc Vitals Group     BP --      Pulse --       Resp --      Temp --      Temp src --      SpO2 --      Weight --      Height --      Head Cir --      Peak Flow --      Pain Score --      Pain Loc --      Pain Edu? --      Excl. in GC? --     Constitutional: Alert and oriented. Increase work of breathing and mild distress. ENT   Head: Normocephalic and atraumatic.   Nose: No congestion/rhinnorhea. Cardiovascular: Normal rate, regular rhythm, no murmur noted Respiratory:  Normal respiratory effort, no tachypnea.    Breath sounds are diminished but equal bilaterally with a mild wheeze. Gastrointestinal: Soft and nontender. No distention.  Back: No muscle spasm, no tenderness, no CVA tenderness. Musculoskeletal: No deformity noted. Nontender with normal range of motion in all extremities.  The patient has 2+ pitting edema bilaterally, slightly worse on the left than the right. Neurologic:  Normal speech and language. No gross focal neurologic deficits are appreciated.  Skin:  Skin is warm, dry. No rash noted. Psychiatric: Mood and affect are normal. Speech and behavior are normal.  ____________________________________________    LABS (pertinent positives/negatives)  Labs Reviewed  CBC - Abnormal; Notable for the following:    WBC 11.7 (*)    RDW 16.0 (*)    All other components within normal limits  COMPREHENSIVE METABOLIC PANEL - Abnormal; Notable for the following:    Chloride 96 (*)    CO2 34 (*)    Total Protein 5.9 (*)    Albumin 3.4 (*)    GFR calc non Af Amer 52 (*)    All other components within normal limits  CULTURE, BLOOD (ROUTINE X 2)  CULTURE, BLOOD (ROUTINE X 2)  TROPONIN I     ____________________________________________   EKG  ED ECG REPORT I, Merlina Marchena W, the attending physician, personally viewed and interpreted this ECG.   Date: 08/02/2015  EKG Time: 9:51 AM  Rate: 88  Rhythm: Normal sinus rhythm with sinus arrhythmia Left anterior fascicular block  Axis: Left axis deviation  at -47  Intervals: Normal  ST&T Change: None noted  ____________________________________________    RADIOLOGY  Chest x-ray:  IMPRESSION: Right upper lobe nodular opacity. Chest CT with contrast is recommended for further evaluation.  COPD. ____________________________________________   PROCEDURES  CRITICAL CARE Performed by: Darien Ramus   Total critical care time: 35 minutes due to the critical nature of this patient's respiratory failure, the need for further restaurant medications and monitoring, discussion with patient, family, and admitting physicians.  Critical care time was exclusive of separately billable procedures and treating other patients.  Critical care was necessary to treat or prevent imminent or life-threatening deterioration.  Critical care was time spent personally by me on the following activities: development of treatment plan with patient and/or surrogate as well as nursing, discussions with consultants, evaluation of patient's response to treatment, examination of patient, obtaining history from patient or surrogate, ordering and performing treatments and interventions, ordering and review of laboratory studies, ordering and review of radiographic studies, pulse oximetry and re-evaluation of patient's condition.    ____________________________________________   INITIAL IMPRESSION / ASSESSMENT AND PLAN / ED COURSE  Pertinent labs & imaging results that were available during my care of the patient were reviewed by me and considered in my medical decision making (see chart for details).  81 year old female with notable history of COPD, now with an acute exasperation that is been going on for 2-3 days. She is requiring a higher amount of supplemental oxygen at this time. She last used the nebulizer this morning at 7:30. We will treat her with another neb and also give her Solu-Medrol. We will initiate doxycycline as an antibiotic. The patient has  significant increase work of breathing and will required admission to the hospital.  ----------------------------------------- 12:42 PM on 08/02/2015 -----------------------------------------  X-ray shows a right upper lobe nodular opacity. Chest CT with contrast is recommended for further evaluation.  I spoke with Dr. Hilton Sinclair regarding this patient and the x-ray result. He will evaluate the patient and consider CT as needed.  ____________________________________________   FINAL CLINICAL IMPRESSION(S) / ED DIAGNOSES  Final diagnoses:  COPD exacerbation  Acute respiratory failure with hypoxia      Darien Ramus, MD 08/02/15 1243

## 2015-08-02 NOTE — H&P (Addendum)
[facility] - [place] at [facility]   PATIENT NAME: Brittany Booth    MR#:  [id number]  DATE OF BIRTH:  11-21-1928  DATE OF ADMISSION:  08/02/2015  PRIMARY CARE PHYSICIAN: Beverely Risen  REQUESTING/REFERRING PHYSICIAN: Janalyn Harder  CHIEF COMPLAINT:   Chief Complaint  Patient presents with  . Shortness of Breath    HISTORY OF PRESENT ILLNESS:  Brittany Booth  is a 81 y.o. female with a known history of end-stage COPD. She presents with chest pain and shortness of breath. Her shortness of breath has been going on for 3 or 4 days. She's been coughing up whitish phlegm and wheezing. Her chest pain comes and goes severe knifelike in the center of the chest. 10 out of 10 intensity. This has happened over the weekend and has continued. In the ER she was working to breathe and hospitalist services were contacted for further evaluation.  PAST MEDICAL HISTORY:   Past Medical History  Diagnosis Date  . COPD (chronic obstructive pulmonary disease)   . Hypertension   . Renal disorder     PAST SURGICAL HISTORY:   Past Surgical History  Procedure Laterality Date  . Appendectomy    . Eye surgery      SOCIAL HISTORY:   Social History  Substance Use Topics  . Smoking status: Former Smoker    Quit date: 12/01/2004  . Smokeless tobacco: Not on file  . Alcohol Use: No    FAMILY HISTORY:   Family History  Problem Relation Age of Onset  . CAD Father     DRUG ALLERGIES:  No Known Allergies  REVIEW OF SYSTEMS:  CONSTITUTIONAL: No fever, chills or sweats. Positive for fatigue.  EYES: No blurred or double vision. Poor vision. Wears glasses. EARS, NOSE, AND THROAT: No tinnitus or ear pain. No sore throat. Decreased hearing. Positive for dysphagia to solids and liquids. RESPIRATORY: Positive for cough and shortness of breath, positive for wheezing. No hemoptysis.  CARDIOVASCULAR: Positive for chest pain and edema.  GASTROINTESTINAL: No nausea, vomiting,  diarrhea. 2 weeks ago saw blood in bowel movements. Positive for constipation and abdominal discomfort occasionally. GENITOURINARY: No dysuria, hematuria.  ENDOCRINE: No polyuria, nocturia,  HEMATOLOGY: No anemia, positive for easy bruising SKIN: No rash or lesion. Bruising. MUSCULOSKELETAL: Some joint pain.   NEUROLOGIC: No tingling, numbness, weakness.  PSYCHIATRY: Positive for anxiety, no depression.   MEDICATIONS AT HOME:   Prior to Admission medications   Medication Sig Start Date End Date Taking? Authorizing Provider  ALPRAZolam Prudy Feeler) 0.25 MG tablet Take 0.25 mg by mouth daily as needed for anxiety.   Yes Historical Provider, MD  cloNIDine (CATAPRES - DOSED IN MG/24 HR) 0.3 mg/24hr patch Place 0.3 mg onto the skin once a week.   Yes Historical Provider, MD  diltiazem (TIAZAC) 360 MG 24 hr capsule Take 360 mg by mouth daily.   Yes Historical Provider, MD  levalbuterol (XOPENEX) 1.25 MG/3ML nebulizer solution Take 1.25 mg by nebulization every 8 (eight) hours as needed for wheezing.   Yes Historical Provider, MD  levothyroxine (SYNTHROID, LEVOTHROID) 88 MCG tablet Take 88 mcg by mouth daily before breakfast.   Yes Historical Provider, MD  metoprolol tartrate (LOPRESSOR) 25 MG tablet Take 25 mg by mouth 2 (two) times daily.   Yes Historical Provider, MD  mirtazapine (REMERON) 15 MG tablet Take 15 mg by mouth at bedtime.   Yes Historical Provider, MD  montelukast (SINGULAIR) 10 MG tablet Take 10 mg by mouth at bedtime.  Yes Historical Provider, MD  Morphine Sulfate (MORPHINE CONCENTRATE) 10 mg / 0.5 ml concentrated solution Take 6.25-10 mg by mouth every 2 (two) hours as needed for severe pain. Morphine Sulfate 100mg /31ml: Take 0.42ml-0.50ml every 2 hours as needed for pain or dyspnea.   Yes Historical Provider, MD  oxyCODONE (OXY IR/ROXICODONE) 5 MG immediate release tablet Take 5 mg by mouth every 8 (eight) hours as needed for severe pain.   Yes Historical Provider, MD  potassium  chloride SA (K-DUR,KLOR-CON) 20 MEQ tablet Take 20 mEq by mouth daily.   Yes Historical Provider, MD  roflumilast (DALIRESP) 500 MCG TABS tablet Take 500 mcg by mouth daily.   Yes Historical Provider, MD  tiotropium (SPIRIVA) 18 MCG inhalation capsule Place 18 mcg into inhaler and inhale daily.   Yes Historical Provider, MD  traMADol (ULTRAM) 50 MG tablet Take 50 mg by mouth 3 (three) times daily.   Yes Historical Provider, MD  zolpidem (AMBIEN) 10 MG tablet Take 10 mg by mouth at bedtime as needed for sleep.   Yes Historical Provider, MD      VITAL SIGNS:  Blood pressure 189/109, pulse 94, temperature 98.6 F (37 C), temperature source Oral, resp. rate 25, height 5\' 3"  (1.6 m), weight 81.647 kg (180 lb), SpO2 95 %.  PHYSICAL EXAMINATION:  GENERAL:  81 y.o.-year-old patient lying in the bed with no acute distress.  EYES: Pupils equal, round, reactive to light and accommodation. No scleral icterus. Extraocular muscles intact.  HEENT: Head atraumatic, normocephalic. Oropharynx and nasopharynx clear.  NECK:  Supple, no jugular venous distention. No thyroid enlargement, no tenderness.  LUNGS: Normal breath sounds bilaterally, no wheezing, rales,rhonchi or crepitation. No use of accessory muscles of respiration.  CARDIOVASCULAR: S1, S2 normal. No murmurs, rubs, or gallops.  ABDOMEN: Soft, nontender, nondistended. Bowel sounds present. No organomegaly or mass.  EXTREMITIES: 3+ edema, no cyanosis, or clubbing.  NEUROLOGIC: Cranial nerves II through XII are intact. Muscle strength 5/5 in all extremities. Sensation intact. Gait not checked.  PSYCHIATRIC: The patient is alert and oriented x 3.  SKIN: No rash, lesion, or ulcer. Bruising upper and lower extremities.  LABORATORY PANEL:   CBC  Recent Labs Lab 08/02/15 1017  WBC 11.7*  HGB 12.2  HCT 37.0  PLT 276   ------------------------------------------------------------------------------------------------------------------  Chemistries    Recent Labs Lab 08/02/15 1017  NA 140  K 3.6  CL 96*  CO2 34*  GLUCOSE 67  BUN 18  CREATININE 0.97  CALCIUM 8.9  AST 22  ALT 24  ALKPHOS 89  BILITOT 0.6   ------------------------------------------------------------------------------------------------------------------  Cardiac Enzymes  Recent Labs Lab 08/02/15 1017  TROPONINI 0.03   ------------------------------------------------------------------------------------------------------------------  RADIOLOGY:  Dg Chest Port 1 View  08/02/2015   CLINICAL DATA:  Increasing shortness of breath and midsternal chest pain for 3 days.  EXAM: PORTABLE CHEST - 1 VIEW  COMPARISON:  None.  FINDINGS: The chest is hyperexpanded with attenuation of the pulmonary vasculature. A nodular opacity projects in the right upper lung zone. Basilar atelectasis is noted. Heart size is normal. Remote right fourth rib fracture is noted.  IMPRESSION: Right upper lobe nodular opacity. Chest CT with contrast is recommended for further evaluation.  COPD.   Electronically Signed   By: Drusilla Kanner M.D.   On: 08/02/2015 11:34    EKG:   Normal sinus rhythm, left anterior fascicular block  IMPRESSION AND PLAN:   1. Acute on chronic respiratory failure. The patient wears 2.5 L of oxygen 24/7. I will  try to dial down to her usual oxygen supplementation. 2. COPD exacerbation- start high-dose Solu-Medrol 60 mg IV every 6 hours, nebulizer treatments, add Spiriva and order doxycycline. 3. Chest pain- likely musculoskeletal in nature. With the patient having chest pain for a few days now unlikely heart attack with troponin being negative. 4. Pulmonary nodule on chest x-ray. Likely will get a CT scan of the chest when she is breathing better. 5. Accelerated hypertension- I will take off her clonidine patch and give clonidine 0.3 mg stat and 0.2 g twice day. Give her usual blood pressure medications and monitor. 6. Hypothyroidism unspecified- continue  levothyroxine. 7. Anxiety- continue Xanax 8. Chronic kidney disease stage III.  All the records are reviewed and case discussed with ED provider. Management plans discussed with the patient, family and they are in agreement.  CODE STATUS: Full code  TOTAL TIME TAKING CARE OF THIS PATIENT: 55 minutes.    Alford Highland M.D on 08/02/2015 at 1:18 PM  Between 7am to 6pm - Pager - [phone]  After 6pm call admission pager [phone]  [facility]  Office  [phone]  CC: Primary care physician; Beverely Risen

## 2015-08-02 NOTE — ED Notes (Signed)
Pt reports experiencing increased shortness of breath and chest pain mid-sternum x3days.  Pt had recent hospital admission at [facility] for COPD exacerbation and pain in chest, husband reports is from coughing so much.  Pt called EMS this morning for the same symptoms as recommended by her home hospice RN.

## 2015-08-02 NOTE — Progress Notes (Signed)
Patient admitted today from home with hospice care following. No complaints of pain. Labored breathing, respirations running in the 20's. Patient's heart rate elevates for a short period of time when moving in bed. Have been using bedpan to not cause any more stress to the patient's breathing. Blood pressure has come down after giving PO cardizem and clonidine. Patient states she thinks she may be a DNR, family is to bring living will this evening. Will update night shift. Sinus rhythm with PAC's on tele in the 90's unless moving around, goes into 100's-1 teen's. Will continue to monitor.

## 2015-08-03 ENCOUNTER — Inpatient Hospital Stay: Payer: Medicare Other

## 2015-08-03 LAB — GLUCOSE, CAPILLARY
GLUCOSE-CAPILLARY: 207 mg/dL — AB (ref 65–99)
Glucose-Capillary: 260 mg/dL — ABNORMAL HIGH (ref 65–99)

## 2015-08-03 LAB — CBC
HEMATOCRIT: 37 % (ref 35.0–47.0)
HEMOGLOBIN: 12.3 g/dL (ref 12.0–16.0)
MCH: 30.5 pg (ref 26.0–34.0)
MCHC: 33.3 g/dL (ref 32.0–36.0)
MCV: 91.4 fL (ref 80.0–100.0)
Platelets: 267 10*3/uL (ref 150–440)
RBC: 4.04 MIL/uL (ref 3.80–5.20)
RDW: 15.6 % — ABNORMAL HIGH (ref 11.5–14.5)
WBC: 6.8 10*3/uL (ref 3.6–11.0)

## 2015-08-03 LAB — BASIC METABOLIC PANEL
ANION GAP: 10 (ref 5–15)
BUN: 21 mg/dL — ABNORMAL HIGH (ref 6–20)
CHLORIDE: 95 mmol/L — AB (ref 101–111)
CO2: 34 mmol/L — AB (ref 22–32)
Calcium: 8.8 mg/dL — ABNORMAL LOW (ref 8.9–10.3)
Creatinine, Ser: 0.8 mg/dL (ref 0.44–1.00)
GFR calc non Af Amer: >60 mL/min (ref >60)
Glucose, Bld: 175 mg/dL — ABNORMAL HIGH (ref 65–99)
Potassium: 3.7 mmol/L (ref 3.5–5.1)
Sodium: 139 mmol/L (ref 135–145)

## 2015-08-03 MED ORDER — LORAZEPAM 2 MG/ML IJ SOLN
1.0000 mg | Freq: Two times a day (BID) | INTRAMUSCULAR | Status: DC | PRN
  Administered 2015-08-03 – 2015-08-04 (×3): 1 mg via INTRAVENOUS
  Filled 2015-08-03 (×3): qty 1

## 2015-08-03 MED ORDER — FUROSEMIDE 10 MG/ML IJ SOLN
INTRAMUSCULAR | Status: AC
Start: 2015-08-03 — End: 2015-08-04
  Filled 2015-08-03: qty 4

## 2015-08-03 MED ORDER — LABETALOL HCL 5 MG/ML IV SOLN
10.0000 mg | INTRAVENOUS | Status: DC | PRN
  Administered 2015-08-03: 10 mg via INTRAVENOUS
  Filled 2015-08-03: qty 4

## 2015-08-03 MED ORDER — ENSURE ENLIVE PO LIQD
237.0000 mL | Freq: Two times a day (BID) | ORAL | Status: DC
  Administered 2015-08-03: 237 mL via ORAL

## 2015-08-03 MED ORDER — FUROSEMIDE 10 MG/ML IJ SOLN
40.0000 mg | Freq: Once | INTRAMUSCULAR | Status: AC
  Administered 2015-08-03: 40 mg via INTRAVENOUS

## 2015-08-03 MED ORDER — HYDRALAZINE HCL 20 MG/ML IJ SOLN
10.0000 mg | INTRAMUSCULAR | Status: DC | PRN
  Administered 2015-08-03 (×2): 10 mg via INTRAVENOUS
  Filled 2015-08-03 (×3): qty 1

## 2015-08-03 MED ORDER — INSULIN ASPART 100 UNIT/ML ~~LOC~~ SOLN
0.0000 [IU] | Freq: Every day | SUBCUTANEOUS | Status: DC
  Administered 2015-08-03: 2 [IU] via SUBCUTANEOUS
  Filled 2015-08-03: qty 2

## 2015-08-03 MED ORDER — INSULIN ASPART 100 UNIT/ML ~~LOC~~ SOLN
0.0000 [IU] | Freq: Three times a day (TID) | SUBCUTANEOUS | Status: DC
  Administered 2015-08-03: 5 [IU] via SUBCUTANEOUS
  Administered 2015-08-04: 7 [IU] via SUBCUTANEOUS
  Administered 2015-08-04: 2 [IU] via SUBCUTANEOUS
  Filled 2015-08-03: qty 7
  Filled 2015-08-03: qty 2
  Filled 2015-08-03: qty 5

## 2015-08-03 NOTE — Progress Notes (Signed)
Initial Nutrition Assessment    INTERVENTION:   Meals and Snacks: Cater to patient preferences, due to SOB, recommend smaller, more frequent meals. Pt reports she snacks throughout the day; will send small snacks betweem meals Medical Food Supplement Therapy: recommend Ensure Enlive po BID, each supplement provides 350 kcal and 20 grams of protein   NUTRITION DIAGNOSIS:   Inadequate oral intake related to acute illness as evidenced by per patient/family report.  GOAL:   Patient will meet greater than or equal to 90% of their needs  MONITOR:    (Energy Intake, Digestive System, Pulmonary, Anthropometrics, Electrolyte/Renal Profile)  REASON FOR ASSESSMENT:   Malnutrition Screening Tool    ASSESSMENT:    Pt admitted with acute on chronic respiratory failure with COPD exacerbation, pulmonary nodule, HTN  Past Medical History  Diagnosis Date  . COPD (chronic obstructive pulmonary disease)   . Hypertension   . Renal disorder     Diet Order:  Diet regular Room service appropriate?: Yes; Fluid consistency:: Thin   Energy Intake: recorded po intake 50% at breakfast; pt reports poor appetite at present, reports inability to eat much at meal times due to SOB  Food and Nutrition Related History: pt reports reduced appetite and intake for past 2 weeks  Electrolyte and Renal Profile:  Recent Labs Lab 08/02/15 1017 08/03/15 0405  BUN 18 21*  CREATININE 0.97 0.80  NA 140 139  K 3.6 3.7   Glucose Profile: No results for input(s): GLUCAP in the last 72 hours. Protein Profile:  Recent Labs Lab 08/02/15 1017  ALBUMIN 3.4*   Nutritional Anemia Profile:  CBC Latest Ref Rng 08/03/2015 08/02/2015  WBC 3.6 - 11.0 K/uL 6.8 11.7(H)  Hemoglobin 12.0 - 16.0 g/dL 16.1 09.6  Hematocrit 04.5 - 47.0 % 37.0 37.0  Platelets 150 - 440 K/uL 267 276    Meds: solumedrol, senokot  Skin:  Reviewed, no issues  Last BM:  9/7  Height:   Ht Readings from Last 1 Encounters:  08/02/15 5'  3" (1.6 m)    Weight: reports wt has been stable  Wt Readings from Last 1 Encounters:  08/02/15 180 lb (81.647 kg)    BMI:  Body mass index is 31.89 kg/(m^2).  LOW Care Level  Romelle Starcher MS, [place], LDN [phone] Pager

## 2015-08-03 NOTE — Progress Notes (Signed)
Informed dr. Anne Hahn that patient was agitated evidenced by patient physically trying to hit staff with telly monitor. Patients displayed impulsiveness evidenced by patient trying to get out of bed. Informed Dr. Anne Hahn that BP WAS 130/115. HR 79. NEW ORDER for labetalol   IV push and ativan  IV PUSH. Will continue to monitor.

## 2015-08-03 NOTE — Progress Notes (Signed)
Pt with c/o increased sob/ RN to bedside to assess/pt states shes been sob since eating dinner/  pt with labored breathing/ crackles and wheezing/  o2 sats 93% 4L/ MD Welton Flakes paged to make aware/ med orders received/ portable chest xray ordered/speech consult ordered/ some improvement / will continue to monitor closely.

## 2015-08-03 NOTE — Care Management Note (Signed)
Case Management Note  Patient Details  Name: Brittany Booth MRN: [id number] Date of Birth: 12/03/1927  Subjective/Objective:     Spouse has expressed a desire to have hospice services increeased, as pt. Is not able to help as much during meals and during ADL's. [facility] that [facility] believe they can increase services once pt. Is assessed.               Action/Plan:   Expected Discharge Date:                  Expected Discharge Plan:     In-House Referral:     Discharge planning Services     Post Acute Care Choice:    Choice offered to:     DME Arranged:    DME Agency:     HH Arranged:    HH Agency:     Status of Service:     Medicare Important Message Given:    Date Medicare IM Given:    Medicare IM give by:    Date Additional Medicare IM Given:    Additional Medicare Important Message give by:     If discussed at Long Length of Stay Meetings, dates discussed:    Additional Comments:  Berna Bue, RN 08/03/2015, 3:07 PM

## 2015-08-03 NOTE — Progress Notes (Signed)
Informed Dr. Anne Hahn about patient's BP 202/88 HR 65, and that patient was constipated  Abdomen distended, pt stated that she is constipated. New orders for senna-docusate, and hydralazine IV 10 mg Q 4hrs.

## 2015-08-03 NOTE — Progress Notes (Signed)
[facility] -  at [facility]   PATIENT NAME: Brittany Booth    MR#:  [id number]  DATE OF BIRTH:  February 21, 1928  SUBJECTIVE:  CHIEF COMPLAINT:   Chief Complaint  Patient presents with  . Shortness of Breath  Choking on her food while eating break-fast and very sob REVIEW OF SYSTEMS:  Review of Systems  Constitutional: Negative for fever, weight loss, malaise/fatigue and diaphoresis.  HENT: Negative for ear discharge, ear pain, hearing loss, nosebleeds, sore throat and tinnitus.   Eyes: Negative for blurred vision and pain.  Respiratory: Positive for cough and shortness of breath. Negative for hemoptysis and wheezing.   Cardiovascular: Negative for chest pain, palpitations, orthopnea and leg swelling.  Gastrointestinal: Negative for heartburn, nausea, vomiting, abdominal pain, diarrhea, constipation and blood in stool.  Genitourinary: Negative for dysuria, urgency and frequency.  Musculoskeletal: Negative for myalgias and back pain.  Skin: Negative for itching and rash.  Neurological: Negative for dizziness, tingling, tremors, focal weakness, seizures, weakness and headaches.  Psychiatric/Behavioral: Negative for depression. The patient is not nervous/anxious.    DRUG ALLERGIES:  No Known Allergies VITALS:  Blood pressure 165/73, pulse 69, temperature 97.8 F (36.6 C), temperature source Oral, resp. rate 26, height 5\' 3"  (1.6 m), weight 81.647 kg (180 lb), SpO2 94 %. PHYSICAL EXAMINATION:  Physical Exam  Constitutional: She is oriented to person, place, and time and well-developed, well-nourished, and in no distress.  HENT:  Head: Normocephalic and atraumatic.  Eyes: Conjunctivae and EOM are normal. Pupils are equal, round, and reactive to light.  Neck: Normal range of motion. Neck supple. No tracheal deviation present. No thyromegaly present.  Cardiovascular: Normal rate, regular rhythm and normal heart sounds.   Pulmonary/Chest: Accessory muscle usage  present. She is in respiratory distress. She has decreased breath sounds. She has wheezes. She has rhonchi. She exhibits no tenderness.  Abdominal: Soft. Bowel sounds are normal. She exhibits no distension. There is no tenderness.  Musculoskeletal: Normal range of motion.  Neurological: She is alert and oriented to person, place, and time. No cranial nerve deficit.  Skin: Skin is warm and dry. No rash noted.  Psychiatric: Mood and affect normal.   LABORATORY PANEL:   CBC  Recent Labs Lab 08/03/15 0405  WBC 6.8  HGB 12.3  HCT 37.0  PLT 267   ------------------------------------------------------------------------------------------------------------------ Chemistries   Recent Labs Lab 08/02/15 1017 08/03/15 0405  NA 140 139  K 3.6 3.7  CL 96* 95*  CO2 34* 34*  GLUCOSE 67 175*  BUN 18 21*  CREATININE 0.97 0.80  CALCIUM 8.9 8.8*  AST 22  --   ALT 24  --   ALKPHOS 89  --   BILITOT 0.6  --    RADIOLOGY:  No results found. ASSESSMENT AND PLAN:  1. Acute on chronic respiratory failure. The patient wears 2.5 L of oxygen 24/7. Will try to wean  2. COPD exacerbation- continue high-dose Solu-Medrol 60 mg IV every 6 hours, nebulizer treatments, Spiriva and empiric doxycycline.  3. Chest pain- likely musculoskeletal in nature. No MI. D/c tele  4. Pulmonary nodule on chest x-ray. Likely will get a CT scan of the chest when she is breathing better.  5. Accelerated hypertension- continue clonidine 0.2 g twice day. Give her usual blood pressure medications and monitor.  6. Hypothyroidism unspecified- continue levothyroxine.  7. Anxiety- continue Xanax  8. Chronic kidney disease stage III: stable    All the records are reviewed and case discussed with Care Management/Social  Worker. Management plans discussed with the patient, family and they are in agreement.  CODE STATUS: DNR  TOTAL TIME (Critical Care) TAKING CARE OF THIS PATIENT: 35 minutes.   She looks quite sick  and is at high risk for cardio-resp failure and death. Discussed code status with patient and family. They are wanting DNR.  More than 50% of the time was spent in counseling/coordination of care: YES  POSSIBLE D/C IN 1-2 DAYS, DEPENDING ON CLINICAL CONDITION. May benefit from placement although she just wants to go back home, apparently she was already followed by Hospice at home. Will ask [facility] cm to see if h   [facility], Marky Buresh M.D on 08/03/2015 at 2:14 PM  Between 7am to 6pm - Pager - [phone]  After 6pm go to [email or web address] - password [email or web address] [facility]  [facility]  Office  [phone]  CC:  Primary care physician; No primary care provider on file.

## 2015-08-03 NOTE — Progress Notes (Signed)
This encounter was created in error - please disregard.

## 2015-08-03 NOTE — Therapy (Signed)
Patient noted on routine rounds to be SOB, with rhonchi and prolonged expiration. Nurse had given neb tx earlier this morning. SpO2 90% on 3 lpm, O2 increased to 4 lpm, SpO2 increased to 94%. Nurse notified of O2 increase.

## 2015-08-03 NOTE — Consult Note (Signed)
Pulmonary Critical Care  Initial Consult Note   Hina Gupta ZOX:[id number] DOB: 1928/10/01 DOA: 08/02/2015  Referring physician: Delfino Lovett, MD PCP: No primary care provider on file.   Chief Complaint: shortness of breath  HPI: Zeniah Briney is a 81 y.o. female with endstage COPD presented with increased SOB. She has cough congestion wheeze noted. She states that she has gotten worse from her baseline She had also been having chest pain and she states there has been increased sputum production. Patient has had this pain evaluated in the past. Currently she states that she is not quite back to baseline and still has dyspnea on minimal exertion. She states that she has no fevers or chills at this time. She has no edema noted no headache no dizziness noted no syncope noted.   Review of Systems:   Remainder ROS performed and is unremarkable other than noted in HPI  Past Medical History  Diagnosis Date  . COPD (chronic obstructive pulmonary disease)   . Hypertension   . Renal disorder    Past Surgical History  Procedure Laterality Date  . Appendectomy    . Eye surgery     Social History:  reports that she quit smoking about 10 years ago. She does not have any smokeless tobacco history on file. She reports that she does not drink alcohol or use illicit drugs.  No Known Allergies  Family History  Problem Relation Age of Onset  . CAD Father     Prior to Admission medications   Medication Sig Start Date End Date Taking? Authorizing Provider  ALPRAZolam Prudy Feeler) 0.25 MG tablet Take 0.25 mg by mouth daily as needed for anxiety.   Yes Historical Provider, MD  cloNIDine (CATAPRES - DOSED IN MG/24 HR) 0.3 mg/24hr patch Place 0.3 mg onto the skin once a week.   Yes Historical Provider, MD  diltiazem (TIAZAC) 360 MG 24 hr capsule Take 360 mg by mouth daily.   Yes Historical Provider, MD  levalbuterol (XOPENEX) 1.25 MG/3ML nebulizer solution Take 1.25 mg by nebulization every 8 (eight) hours as  needed for wheezing.   Yes Historical Provider, MD  levothyroxine (SYNTHROID, LEVOTHROID) 88 MCG tablet Take 88 mcg by mouth daily before breakfast.   Yes Historical Provider, MD  metoprolol tartrate (LOPRESSOR) 25 MG tablet Take 25 mg by mouth 2 (two) times daily.   Yes Historical Provider, MD  mirtazapine (REMERON) 15 MG tablet Take 15 mg by mouth at bedtime.   Yes Historical Provider, MD  montelukast (SINGULAIR) 10 MG tablet Take 10 mg by mouth at bedtime.   Yes Historical Provider, MD  Morphine Sulfate (MORPHINE CONCENTRATE) 10 mg / 0.5 ml concentrated solution Take 6.25-10 mg by mouth every 2 (two) hours as needed for severe pain. Morphine Sulfate 100mg /25ml: Take 0.33ml-0.50ml every 2 hours as needed for pain or dyspnea.   Yes Historical Provider, MD  oxyCODONE (OXY IR/ROXICODONE) 5 MG immediate release tablet Take 5 mg by mouth every 8 (eight) hours as needed for severe pain.   Yes Historical Provider, MD  potassium chloride SA (K-DUR,KLOR-CON) 20 MEQ tablet Take 20 mEq by mouth daily.   Yes Historical Provider, MD  roflumilast (DALIRESP) 500 MCG TABS tablet Take 500 mcg by mouth daily.   Yes Historical Provider, MD  tiotropium (SPIRIVA) 18 MCG inhalation capsule Place 18 mcg into inhaler and inhale daily.   Yes Historical Provider, MD  traMADol (ULTRAM) 50 MG tablet Take 50 mg by mouth 3 (three) times daily.   Yes Historical  Provider, MD  zolpidem (AMBIEN) 10 MG tablet Take 10 mg by mouth at bedtime as needed for sleep.   Yes Historical Provider, MD   Physical Exam: Filed Vitals:   08/03/15 0800 08/03/15 0811 08/03/15 1434 08/03/15 1435  BP: 165/73  124/64   Pulse: 82 69 65   Temp:   97.9 F (36.6 C)   TempSrc:   Oral   Resp: Height:      Weight:      SpO2: 96% 94% 96% 95%    Wt Readings from Last 3 Encounters:  08/02/15 81.647 kg (180 lb)    General:  Appears anxious Eyes: PERRL, normal lids, irises & conjunctiva ENT: grossly normal hearing, lips & tongue Neck: no  LAD, masses or thyromegaly Cardiovascular: RRR, no m/r/g. No LE edema. Respiratory: diminished with ronchi noted bilaterally. Abdomen: soft, nontender Skin: multiple areas of ecchymosis noted Musculoskeletal: grossly normal tone BUE/BLE Psychiatric: grossly normal mood and affect Neurologic: grossly non-focal.          Labs on Admission:  Basic Metabolic Panel:  Recent Labs Lab 08/02/15 1017 08/03/15 0405  NA 140 139  K 3.6 3.7  CL 96* 95*  CO2 34* 34*  GLUCOSE 67 175*  BUN 18 21*  CREATININE 0.97 0.80  CALCIUM 8.9 8.8*   Liver Function Tests:  Recent Labs Lab 08/02/15 1017  AST 22  ALT 24  ALKPHOS 89  BILITOT 0.6  PROT 5.9*  ALBUMIN 3.4*   No results for input(s): LIPASE, AMYLASE in the last 168 hours. No results for input(s): AMMONIA in the last 168 hours. CBC:  Recent Labs Lab 08/02/15 1017 08/03/15 0405  WBC 11.7* 6.8  HGB 12.2 12.3  HCT 37.0 37.0  MCV 91.2 91.4  PLT 276 267   Cardiac Enzymes:  Recent Labs Lab 08/02/15 1017 08/02/15 1503 08/02/15 1725  TROPONINI 0.03 0.03 <0.03    BNP (last 3 results) No results for input(s): BNP in the last 8760 hours.  ProBNP (last 3 results) No results for input(s): PROBNP in the last 8760 hours.  CBG:  Recent Labs Lab 08/03/15 1622  GLUCAP 260*    Radiological Exams on Admission: Dg Chest Port 1 View  08/02/2015   CLINICAL DATA:  Increasing shortness of breath and midsternal chest pain for 3 days.  EXAM: PORTABLE CHEST - 1 VIEW  COMPARISON:  None.  FINDINGS: The chest is hyperexpanded with attenuation of the pulmonary vasculature. A nodular opacity projects in the right upper lung zone. Basilar atelectasis is noted. Heart size is normal. Remote right fourth rib fracture is noted.  IMPRESSION: Right upper lobe nodular opacity. Chest CT with contrast is recommended for further evaluation.  COPD.   Electronically Signed   By: Drusilla Kanner M.D.   On: 08/02/2015 11:34    EKG: Independently  reviewed.  Assessment/Plan Active Problems:   Acute on chronic respiratory failure with hypoxia   1. Acute on chronic respiratory failure with hypoxia and hypercapnea -patient has end stage COPD with an acute exacerbation at this time -agree with oxygen therapy -aggressive bronchodilators -would continue with antibiotics -her prognosis is poor overall -she is a DNR  2. COPD with exacerbation -as above  -overall prognosis is poor -she is on hospice care at this time   Time spent:    I have personally obtained a history, examined the patient, evaluated laboratory and imaging results, formulated the assessment and plan and placed orders.  The Patient requires high  complexity decision making for assessment and support.    Yevonne Pax, MD [facility] Pulmonary Critical Care Medicine Sleep Medicine

## 2015-08-03 NOTE — Care Management Note (Signed)
Case Management Note  Patient Details  Name: Brittany Booth MRN: [id number] Date of Birth: [birth year]  Subjective/Objective:       Spoke to spouse in room, he cannot  Continue with services through Hospice as it stands. Pt. Does choke on regular diet, and MD has cautioned pt about this. I contacted several agencies because the pt. Spouse did not know which one cares for the pt. It turns out that [employer] is the agency. I spoke to [place] who said the CM would assess the pt. Status, but she is sure they can increase her nursing services at home.             Action/Plan:   Expected Discharge Date:                  Expected Discharge Plan:     In-House Referral:     Discharge planning Services     Post Acute Care Choice:    Choice offered to:     DME Arranged:    DME Agency:     HH Arranged:    HH Agency:     Status of Service:     Medicare Important Message Given:    Date Medicare IM Given:    Medicare IM give by:    Date Additional Medicare IM Given:    Additional Medicare Important Message give by:     If discussed at Long Length of Stay Meetings, dates discussed:    Additional Comments:  Berna Bue, RN 08/03/2015, 9:35 AM

## 2015-08-03 NOTE — Evaluation (Signed)
Physical Therapy Evaluation Patient Details Name: Brittany Booth MRN: [id number] DOB: [birth year] Today's Date: 08/03/2015   History of Present Illness  81 yo female with SOB was admitted for chest pain, and weakness, has end stage COPD.    Clinical Impression  Pt was able to do some limited work, requiring very little help but very SOB despite her O2 sat readings.  Her plan is to continue PT to build a bit more endurance and will expect her to resume her home care services with PT to work on mobility and see if she can be more comfortable with gait at home.  Balance and safety training will be needed as well.    Follow Up Recommendations Home health PT;Supervision/Assistance - 24 hour    Equipment Recommendations  None recommended by PT    Recommendations for Other Services       Precautions / Restrictions Precautions Precautions: Fall Precaution Comments: up on chair pad Restrictions Weight Bearing Restrictions: No      Mobility  Bed Mobility Overal bed mobility: Needs Assistance Bed Mobility: Supine to Sit     Supine to sit: Min assist     General bed mobility comments: light help under trunk and slight help to scoot out to EOB  Transfers Overall transfer level: Needs assistance Equipment used: Rolling walker (2 wheeled);1 person hand held assist Transfers: Sit to/from [employer] Sit to Stand: Min guard;Min assist Stand pivot transfers: Min guard;Min assist       General transfer comment: pt uses hands to power up but LE's need minor help  Ambulation/Gait Ambulation/Gait assistance: Min guard Ambulation Distance (Feet): 5 Feet Assistive device: Rolling walker (2 wheeled);1 person hand held assist Gait Pattern/deviations: Step-to pattern;Narrow base of support;Trunk flexed Gait velocity: reduced Gait velocity interpretation: Below normal speed for age/gender General Gait Details: limited tolerance due to SOB  Stairs            Wheelchair  Mobility    Modified Rankin (Stroke Patients Only)       Balance Overall balance assessment: Needs assistance Sitting-balance support: Feet supported Sitting balance-Leahy Scale: Fair   Postural control: Posterior lean Standing balance support: Bilateral upper extremity supported Standing balance-Leahy Scale: Poor                               Pertinent Vitals/Pain Pain Assessment: No/denies pain    Home Living Family/patient expects to be discharged to:: Private residence Living Arrangements: Spouse/significant other Available Help at Discharge: Family;Available 24 hours/day Type of Home: House Home Access: Stairs to enter Entrance Stairs-Rails: None Entrance Stairs-Number of Steps: 1 Home Layout: One level Home Equipment: Walker - 2 wheels;Shower seat Additional Comments: asks for 3 in one commode    Prior Function Level of Independence: Independent with assistive device(s)               Hand Dominance        Extremity/Trunk Assessment   Upper Extremity Assessment: Generalized weakness           Lower Extremity Assessment: Generalized weakness      Cervical / Trunk Assessment: Kyphotic  Communication   Communication: No difficulties  Cognition Arousal/Alertness: Awake/alert Behavior During Therapy: WFL for tasks assessed/performed Overall Cognitive Status: Within Functional Limits for tasks assessed                      General Comments General comments (skin integrity, edema, etc.):  Pt is having some difficulty with controlling breath, has to rest frequently but is maintaining 90% or greater O2 sats on 4L O2 where RT had increased her.    Exercises        Assessment/Plan    PT Assessment Patient needs continued PT services  PT Diagnosis Generalized weakness   PT Problem List Decreased strength;Decreased range of motion;Decreased activity tolerance;Decreased balance;Decreased mobility;Cardiopulmonary status limiting  activity  PT Treatment Interventions DME instruction;Gait training;Stair training;Therapeutic exercise;Therapeutic activities;Functional mobility training;Balance training;Neuromuscular re-education;Patient/family education   PT Goals (Current goals can be found in the Care Plan section) Acute Rehab PT Goals Patient Stated Goal: to walk comfortably  PT Goal Formulation: With patient Time For Goal Achievement: 08/17/15 Potential to Achieve Goals: Good    Frequency Min 2X/week   Barriers to discharge Other (comment) (continual standing monitoring needed due to breath)      Co-evaluation               End of Session Equipment Utilized During Treatment: Oxygen Activity Tolerance: Patient tolerated treatment well;Patient limited by fatigue Patient left: in chair;with call bell/phone within reach;with chair alarm set Nurse Communication: Mobility status         Time: 0454-0981 PT Time Calculation (min) (ACUTE ONLY): 29 min   Charges:   PT Evaluation $Initial PT Evaluation Tier I: 1 Procedure PT Treatments $Therapeutic Activity: 8-22 mins   PT G Codes:        Ivar Drape Aug 30, 2015, 12:16 PM   Samul Dada, PT MS Acute Rehab Dept. Number: [facility] [id number] and [facility] [phone]

## 2015-08-04 LAB — HEMOGLOBIN A1C: HEMOGLOBIN A1C: 6.6 % — AB (ref 4.0–6.0)

## 2015-08-04 LAB — GLUCOSE, CAPILLARY
GLUCOSE-CAPILLARY: 194 mg/dL — AB (ref 65–99)
GLUCOSE-CAPILLARY: 323 mg/dL — AB (ref 65–99)

## 2015-08-04 MED ORDER — HALOPERIDOL LACTATE 5 MG/ML IJ SOLN
5.0000 mg | Freq: Once | INTRAMUSCULAR | Status: AC
  Administered 2015-08-04: 5 mg via INTRAVENOUS
  Filled 2015-08-04: qty 1

## 2015-08-04 MED ORDER — ALPRAZOLAM 0.25 MG PO TABS: 0.2500 mg | ORAL_TABLET | Freq: Every day | ORAL | Status: AC | PRN

## 2015-08-04 MED ORDER — HALOPERIDOL LACTATE 5 MG/ML IJ SOLN
1.0000 mg | Freq: Four times a day (QID) | INTRAMUSCULAR | Status: DC | PRN
  Administered 2015-08-04: 1 mg via INTRAVENOUS
  Filled 2015-08-04: qty 1

## 2015-08-04 MED ORDER — MORPHINE SULFATE (CONCENTRATE) 10 MG/0.5ML PO SOLN: 6.2500 mg | ORAL | Status: AC | PRN

## 2015-08-04 NOTE — Progress Notes (Signed)
Notified by [facility] of new Hospice Home referral. Mrs. Houchin is an 81 year old woman with end stage COPD, who presented to the [facility] ED with complaints of chest pain and worsening shortness of breath x 3-4 days. She has received IV antibiotics and IV steroids, despite this treatment she has continued to require increased oxygen as well as medication for anxiety/agitaion. Her appetite is poor. PMH includes: HTN and renal disease. Attending physician Dr. Manuella Ghazi has met with patient's husband and he has chosen to pursue comfort and symptom management at the Hospice home. Writer met outside of the room with patient's husband Eddie Dibbles. Education initiated regarding hospice services, philosophy and team approach to care with good understanding voiced. Consents signed and faxed along with patient information to referral intake. Writer did speak with Mrs. Lewin regarding her transfer to the hospice home, she did voice understanding and agreement. She remains dyspneic with conversaion.  Plan is for transport to the hospice hometoday via EMS with portable DNR in place. Report called to hospice home, Mr. Barna,  [facility] Malachy Mood, CSW Caryl Pina, Staff RN [place] and attending physician Dr. Manuella Ghazi all aware and in agreement with discharge plan. EMS notified for pick up. Thank you for the opportunity to participate in the care of this patient. Flo Shanks RN, BSN, [place] and Palliative care of [place], [facility] [phone] c

## 2015-08-04 NOTE — Progress Notes (Signed)
Pt is being combative & pulling at her tele box and oxygen & trying to get out of bed. Remeron, Ambien & ativan have all been given with no relief. Dr. Sheryle Hail came up & assessed her, he put orders in for a one time dose of haldol. No other orders. Will continue to monitor. Shirley Friar, RN

## 2015-08-04 NOTE — Progress Notes (Signed)
Inpatient Diabetes Program Recommendations  AACE/ADA: New Consensus Statement on Inpatient Glycemic Control (2013)  Target Ranges:  Prepandial:   less than 140 mg/dL      Peak postprandial:   less than 180 mg/dL (1-2 hours)      Critically ill patients:  140 - 180 mg/dL     Reason for glycemic review; Results for GENNA, CASIMIR (MRN [id number]) as of 08/04/2015 10:45  Ref. Range 08/03/2015 16:22 08/03/2015 19:37 08/04/2015 07:19  Glucose-Capillary Latest Ref Range: 65-99 mg/dL 409 (H) 811 (H) 914 (H)   Diabetes history: Type 2 Outpatient Diabetes medications: none Current orders for Inpatient glycemic control: Novolog 0-9 units tid, 0-5 units qhs  While the patient is on steroids and the blood sugars are elevated, may consider adding low dose basal insulin, Lantus 9 units qhs (0.1unit/kg).   Insulin will need to be discontinued/decreased as steroids are weaned.   Susette Racer, RN, BA, MHA, CDE Diabetes Coordinator Inpatient Diabetes Program  [phone] (Team Pager) [phone] [facility] Office) 08/04/2015 11:22 AM

## 2015-08-04 NOTE — Discharge Instructions (Signed)
Discharge Instructions  The hospital and staff members extend their best wishes to you as you are discharged. Because we are most concerned with your health, we suggest you carefully read the following instructions.  Activity Instructions  YOU HAVE NO RESTRICTIONS UNLESS INSTRUCTED OTHERWISE  Restrictions: none  YOU ARE TO CONTINUE PREADMISSION DIET UNLESS OTHERWISE INSTRUCTED  Diet: pleasure feeds as tolerated, aspiration precautions  Urination: consider foley if need for comfort  Bowels: consider stool softners as need     Pain Assessment     Duration:  chronic  Current pain management regimen- Instructed: Yes.    IF PAIN INTENSIFIES OR PAIN IS UNRELIEVED WITH MEDICATIONS AS ORDERED, CALL YOUR DOCTOR - Instructed: Yes.   ____________________________________________________________________________  Remember to contact your doctor for follow-up as instructed  Follow-up with Hospice Director in 1 day  Check with your doctor regarding any labs, xrays, or studies that have not received final results. If you have problems related to your current illness/procedures or if your symptoms persist or worsen, please contact your doctor. ____________________________________________________________________________  [facility] is a service that is designed to provide people who are terminally ill and their families with medical, spiritual, and psychological support. Its aim is to improve your quality of life by keeping you as alert and comfortable as possible. Hospice is performed by a team of health care professionals and volunteers who:  Help keep you comfortable. Hospice can be provided in your home or in a homelike setting. The hospice staff works with your family and friends to help meet your needs. You will enjoy the support of loved ones by receiving much of your basic care from family and friends.  Provide pain relief and manage your symptoms. The staff supply all necessary  medicines and equipment.  Provide companionship when you are alone.  Allow you and your family to rest. They may do light housekeeping, prepare meals, and run errands.  Provide counseling. They will make sure your emotional, spiritual, and social needs and those of your family are being met.  Provide spiritual care. Spiritual care is individualized to meet your needs and your family's needs. It may involve helping you look at what death means to you, say goodbye, or perform a specific religious ceremony or ritual. Hospice teams often include:  A nurse.  A doctor.  Social workers.  Religious leaders (such as a Clinical biochemist).  Trained volunteers. WHEN SHOULD HOSPICE CARE BEGIN? Most people who use hospice are believed to have fewer than 6 months to live. Your family and health care providers can help you decide when hospice services should begin. If your condition improves, you may discontinue the program. WHAT [place]? Most hospice programs are run by nonprofit, independent organizations. Some are affiliated with hospitals, nursing homes, or home health care agencies. Hospice programs can take place in the home or at a hospice center, hospital, or skilled nursing facility. When choosing a hospice program, ask the following questions:  What services are available to me?  What services are offered to my loved ones?  How involved are my loved ones?  How involved is my health care provider?  Who makes up the hospice care team? How are they trained or screened?  How will my pain and symptoms be managed?  If my circumstances change, can the services be provided in a different setting, such as my home or in the hospital?  Is the program reviewed and licensed by the state or certified in some  other way? °WHERE CAN I LEARN MORE ABOUT HOSPICE? °You can learn about existing hospice programs in your area from your health care providers. You can also read  more about hospice online. The websites of the following organizations contain helpful information: °· The National Hospice and Palliative Care Organization (NHPCO). °· The Hospice Association of America (HAA). °· The Hospice Education Institute. °· The American Cancer Society (ACS). °· Hospice Net. °Document Released: 02/29/2004 Document Revised: 11/17/2013 Document Reviewed: 09/22/2013 °ExitCare® Patient Information ©2015 ExitCare, LLC. This information is not intended to replace advice given to you by your health care provider. Make sure you discuss any questions you have with your health care provider. ° ° °

## 2015-08-04 NOTE — Progress Notes (Signed)
Clarified that patient does not need to be on tele anymore per d/c order. Dr. Sherryll Burger confirmed to remove cardiac monitoring and d/c continuous pulse ox.

## 2015-08-04 NOTE — Care Management Note (Signed)
Case Management Note  Patient Details  Name: Zacari Radick MRN: [id number] Date of Birth: [birth year] Subjective/Objective: Contacted Dayna Barker at [facility] per Dr Delfino Lovett. The pt. Is worse than yesterday, with notes referring to poor respiratory status. The spouse remains at bedside. Dr Sherryll Burger would like to know if the pt. Can go to Hospice Home. Family is in agreement.               Action/Plan:   Expected Discharge Date:                  Expected Discharge Plan:     In-House Referral:     Discharge planning Services     Post Acute Care Choice:    Choice offered to:     DME Arranged:    DME Agency:     HH Arranged:    HH Agency:     Status of Service:     Medicare Important Message Given:    Date Medicare IM Given:    Medicare IM give by:    Date Additional Medicare IM Given:    Additional Medicare Important Message give by:     If discussed at Long Length of Stay Meetings, dates discussed:    Additional Comments:  Berna Bue, RN 08/04/2015, 9:50 AM

## 2015-08-04 NOTE — Progress Notes (Signed)
PT Cancellation Note  Patient Details Name: Brittany Booth MRN: [id number] DOB: 03-09-1928   Cancelled Treatment:    Reason Eval/Treat Not Completed: Patient declined, no reason specified.  Transferring to Hospice.   Ivar Drape 08/04/2015, 1:28 PM   Samul Dada, PT MS Acute Rehab Dept. Number: [facility] [id number] and [facility] [phone]

## 2015-08-04 NOTE — Progress Notes (Signed)
Per night shift PRN ativan has caused patient to hallucinate and become more restless. Haldol helped last night. Patient is currently very restless and trying to get up. Notified Dr. Sherryll Burger. MD will change PRN orders.

## 2015-08-04 NOTE — Progress Notes (Signed)
Speech Therapy Note:  Received order and reviewed chart notes, consulted with NSG and Hospice coordinator, Clydie Braun, who indicated SLP BSE not necessary at this time (as pt is being transferred today to Hospice home). She will be f/u with at the Hospice home as necessary.  NSG updated and agreed.  ST will be available for any further education as necessary while pt is admitted.

## 2015-08-04 NOTE — Patient Outreach (Signed)
[facility]) Care Management  08/04/2015  Brittany Booth [birth year] [id number]   Consult from MD patient admitted to [facility], notification sent to [facility] Care Management hospital liaisons.  Thanks, Corrie Mckusick. Sharlee Blew [facility] Care Management [facility] CM Assistant Phone: [phone] Fax: [phone]

## 2015-08-04 NOTE — Progress Notes (Signed)
EMS has arrived. IV removed. Patient ready for transport. Put on stretcher now.

## 2015-08-04 NOTE — Discharge Summary (Signed)
[facility] - [place] at [facility]   PATIENT NAME: Brittany Booth    MR#:  [id number]  DATE OF BIRTH:  Nov 06, 1928  DATE OF ADMISSION:  08/02/2015 ADMITTING PHYSICIAN: Alford Highland, MD  DATE OF DISCHARGE: 08/04/2015  PRIMARY CARE PHYSICIAN: Beverely Risen MD   ADMISSION DIAGNOSIS:  COPD exacerbation [J44.1] Acute respiratory failure with hypoxia [J96.01] DISCHARGE DIAGNOSIS:  Active Problems:   Acute on chronic respiratory failure with hypoxia  SECONDARY DIAGNOSIS:   Past Medical History  Diagnosis Date  . COPD (chronic obstructive pulmonary disease)   . Hypertension   . Renal disorder    HOSPITAL COURSE:  81 y.o. female with a known history of end-stage COPD was followed by community hospice at home.  She started having worsening shortness of breath with coughing, likely due to aspiration and family decided to bring her to the hospital.  While in the hospital.  She was felt to be continuously aspirating with intermittent choking spell with any food.  I had a long discussion with family and explained what a very poor prognosis with high risk for recurrent aspirations.  They were agreeable with hospice home placement where she is being discharged in critical condition. DISCHARGE CONDITIONS:  Critical CONSULTS OBTAINED:  Treatment Team:  Yevonne Pax, MD DRUG ALLERGIES:  No Known Allergies DISCHARGE MEDICATIONS:   Current Discharge Medication List    CONTINUE these medications which have CHANGED   Details  ALPRAZolam (XANAX) 0.25 MG tablet Take 1 tablet (0.25 mg total) by mouth daily as needed for anxiety. Qty: 30 tablet, Refills: 0    Morphine Sulfate (MORPHINE CONCENTRATE) 10 MG/0.5ML SOLN concentrated solution Take 0.31-0.5 mLs (6.25-10 mg total) by mouth every 2 (two) hours as needed for severe pain or anxiety. Qty: 42 mL, Refills: 0      STOP taking these medications     cloNIDine (CATAPRES - DOSED IN MG/24 HR) 0.3 mg/24hr patch      diltiazem (TIAZAC) 360 MG 24 hr capsule      levalbuterol (XOPENEX) 1.25 MG/3ML nebulizer solution      levothyroxine (SYNTHROID, LEVOTHROID) 88 MCG tablet      metoprolol tartrate (LOPRESSOR) 25 MG tablet      mirtazapine (REMERON) 15 MG tablet      montelukast (SINGULAIR) 10 MG tablet      oxyCODONE (OXY IR/ROXICODONE) 5 MG immediate release tablet      potassium chloride SA (K-DUR,KLOR-CON) 20 MEQ tablet      roflumilast (DALIRESP) 500 MCG TABS tablet      tiotropium (SPIRIVA) 18 MCG inhalation capsule      traMADol (ULTRAM) 50 MG tablet      zolpidem (AMBIEN) 10 MG tablet        DISCHARGE INSTRUCTIONS:   DIET:  Regular diet DISCHARGE CONDITION:  Critical ACTIVITY:  Activity as tolerated OXYGEN:  Home Oxygen: Yes.    Oxygen Delivery: 2 liters/min via Patient connected to nasal cannula oxygen DISCHARGE LOCATION:  Hospice home   If you experience worsening of your admission symptoms, develop shortness of breath, life threatening emergency, suicidal or homicidal thoughts you must seek medical attention immediately by calling 911 or calling your MD immediately  if symptoms less severe.  You Must read complete instructions/literature along with all the possible adverse reactions/side effects for all the Medicines you take and that have been prescribed to you. Take any new Medicines after you have completely understood and accpet all the possible adverse reactions/side effects.   Please note  You were cared for by a hospitalist during your hospital stay. If you have any questions about your discharge medications or the care you received while you were in the hospital after you are discharged, you can call the unit and asked to speak with the hospitalist on call if the hospitalist that took care of you is not available. Once you are discharged, your primary care physician will handle any further medical issues. Please note that NO REFILLS for any discharge medications will  be authorized once you are discharged, as it is imperative that you return to your primary care physician (or establish a relationship with a primary care physician if you do not have one) for your aftercare needs so that they can reassess your need for medications and monitor your lab values.    On the day of Discharge: VITAL SIGNS:  Blood pressure 168/59, pulse 63, temperature 98.3 F (36.8 C), temperature source Oral, resp. rate 24, height  (1.6 m), weight 81.647 kg (180 lb), SpO2 100 %. PHYSICAL EXAMINATION:  GENERAL:  81 y.o.-year-old patient lying in the bed in acute respiratory distress.  Constitutional: She is oriented to person, place, and time and malnourished, cachectic female.  HENT:  Head: Normocephalic and atraumatic.  Eyes: Conjunctivae and EOM are normal. Pupils are equal, round, and reactive to light.  Neck: Normal range of motion. Neck supple. No tracheal deviation present. No thyromegaly present.  Cardiovascular: Normal rate, regular rhythm and normal heart sounds.  Pulmonary/Chest: Accessory muscle usage present. She is in respiratory distress. She has decreased breath sounds. She has wheezes. She has rhonchi. She exhibits no tenderness.  Abdominal: Soft. Bowel sounds are normal. She exhibits no distension. There is no tenderness.  Musculoskeletal: Normal range of motion.  Neurological: She is alert and oriented to person, place, and time. No cranial nerve deficit.  Skin: Skin is warm and dry. No rash noted.  Psychiatric: Mood and affect normal.  DATA REVIEW:   CBC  Recent Labs Lab 08/03/15 0405  WBC 6.8  HGB 12.3  HCT 37.0  PLT 267    Chemistries   Recent Labs Lab 08/02/15 1017 08/03/15 0405  NA 140 139  K 3.6 3.7  CL 96* 95*  CO2 34* 34*  GLUCOSE 67 175*  BUN 18 21*  CREATININE 0.97 0.80  CALCIUM 8.9 8.8*  AST 22  --   ALT 24  --   ALKPHOS 89  --   BILITOT 0.6  --    Microbiology Results  Results for orders placed or performed during  the hospital encounter of 08/02/15  Culture, blood (routine x 2)     Status: None (Preliminary result)   Collection Time: 08/02/15  1:00 PM  Result Value Ref Range Status   Specimen Description BLOOD RIGHT HAND  Final   Special Requests   Final    BOTTLES DRAWN AEROBIC AND ANAEROBIC 2CC AEROBIC,1CC ANAEROBIC   Culture NO GROWTH 2 DAYS  Final   Report Status PENDING  Incomplete    RADIOLOGY:  Dg Chest Port 1 View  08/03/2015   CLINICAL DATA:  Chest congestion, cough and shortness of breath. COPD.  EXAM: PORTABLE CHEST - 1 VIEW  COMPARISON:  Single view of the chest 08/02/2015.  FINDINGS: The lungs are clear. Possible nodular opacity in the right upper lobe seen on the comparison examination is not identified today. Heart size is normal. No pneumothorax or pleural effusion.  IMPRESSION: No acute disease.   Electronically Signed   By: Maisie Fus  Dalessio M.D.   On: 08/03/2015 18:46     Management plans discussed with the patient, family and they are in agreement.  CODE STATUS: DO NOT RESUSCITATE  TOTAL TIME TAKING CARE OF THIS PATIENT: 55 minutes.    [facility], Yomar Mejorado M.D on 08/04/2015 at 12:47 PM  Between 7am to 6pm - Pager - [phone]  After 6pm go to [email or web address] - password [email or web address] [facility]  [employer] [place] [facility]  Office  [phone]  CC: Primary care physician; Beverely Risen MD [facility]

## 2015-08-04 NOTE — Progress Notes (Signed)
Patient will transfer to hospice home today. Everything for transport has been completed. Patient is dressed and ready to go by EMS. AVS added to packet created by hospice RN. Will continue to assess and treat patient until EMS arrives. Telemetry was already removed this AM. IV will be removed prior to transport.

## 2015-08-04 NOTE — Consult Note (Signed)
   [facility] CM Inpatient Consult   08/04/2015  Brittany Booth 1928-04-19 [id number]   Referral received for [name] Care Management services. Chart reviewed and noted patient is being transferred to hospice home today. Therefore, [name] Care Management services is not appropriate at this time. Will alert inpatient team.  Raiford Noble, MSN-Ed, RN,BSN [facility] Liaison [phone]

## 2015-08-07 LAB — CULTURE, BLOOD (ROUTINE X 2): Culture: NO GROWTH

## 2015-08-19 ENCOUNTER — Encounter: Payer: Self-pay | Admitting: Physician Assistant

## 2015-08-27 DEATH — deceased

## 2016-01-15 IMAGING — CR DG CHEST 2V
2 series · 2 of 2 positions shown · non-contrast
Comparison: 05/19/2014

CLINICAL DATA: Productive cough.

EXAM:
CHEST  2 VIEW

[left lateral]
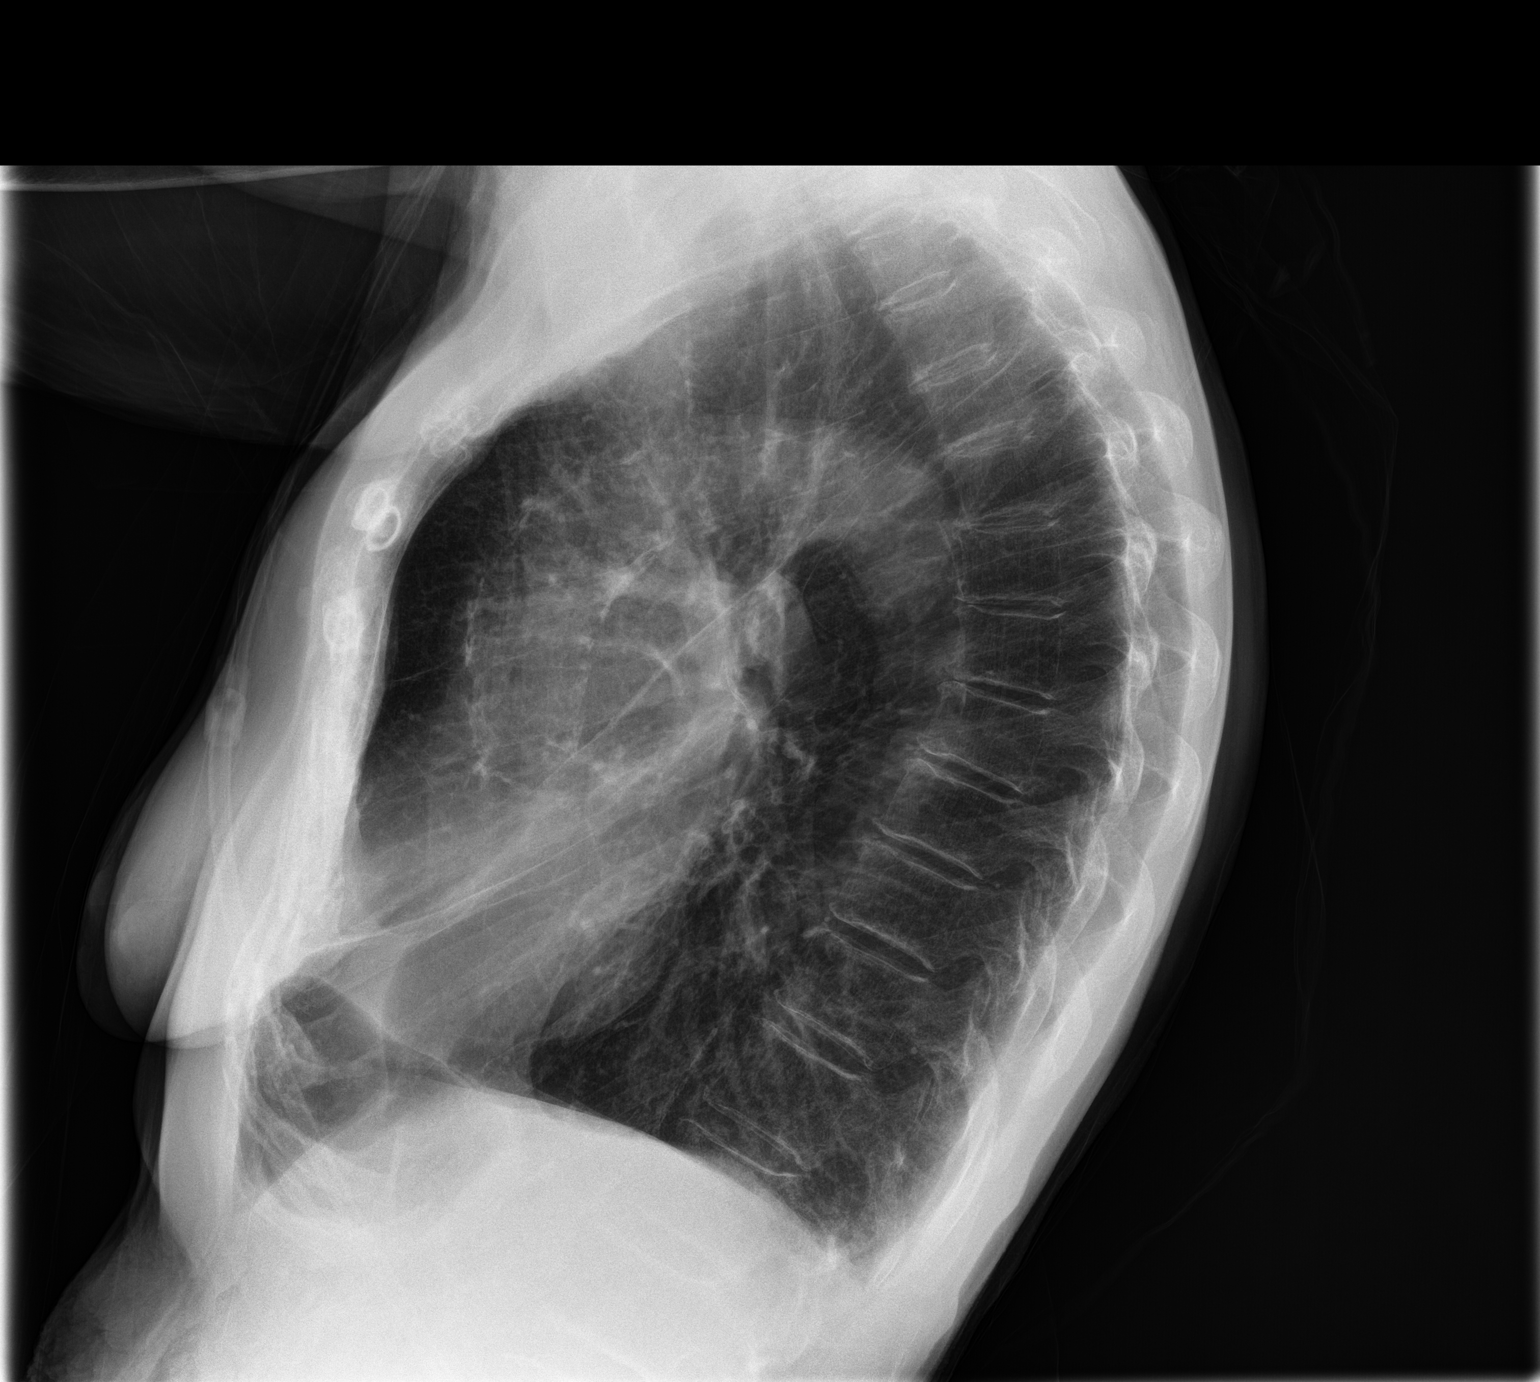

[kdxr chest pa (or ap) and lat]
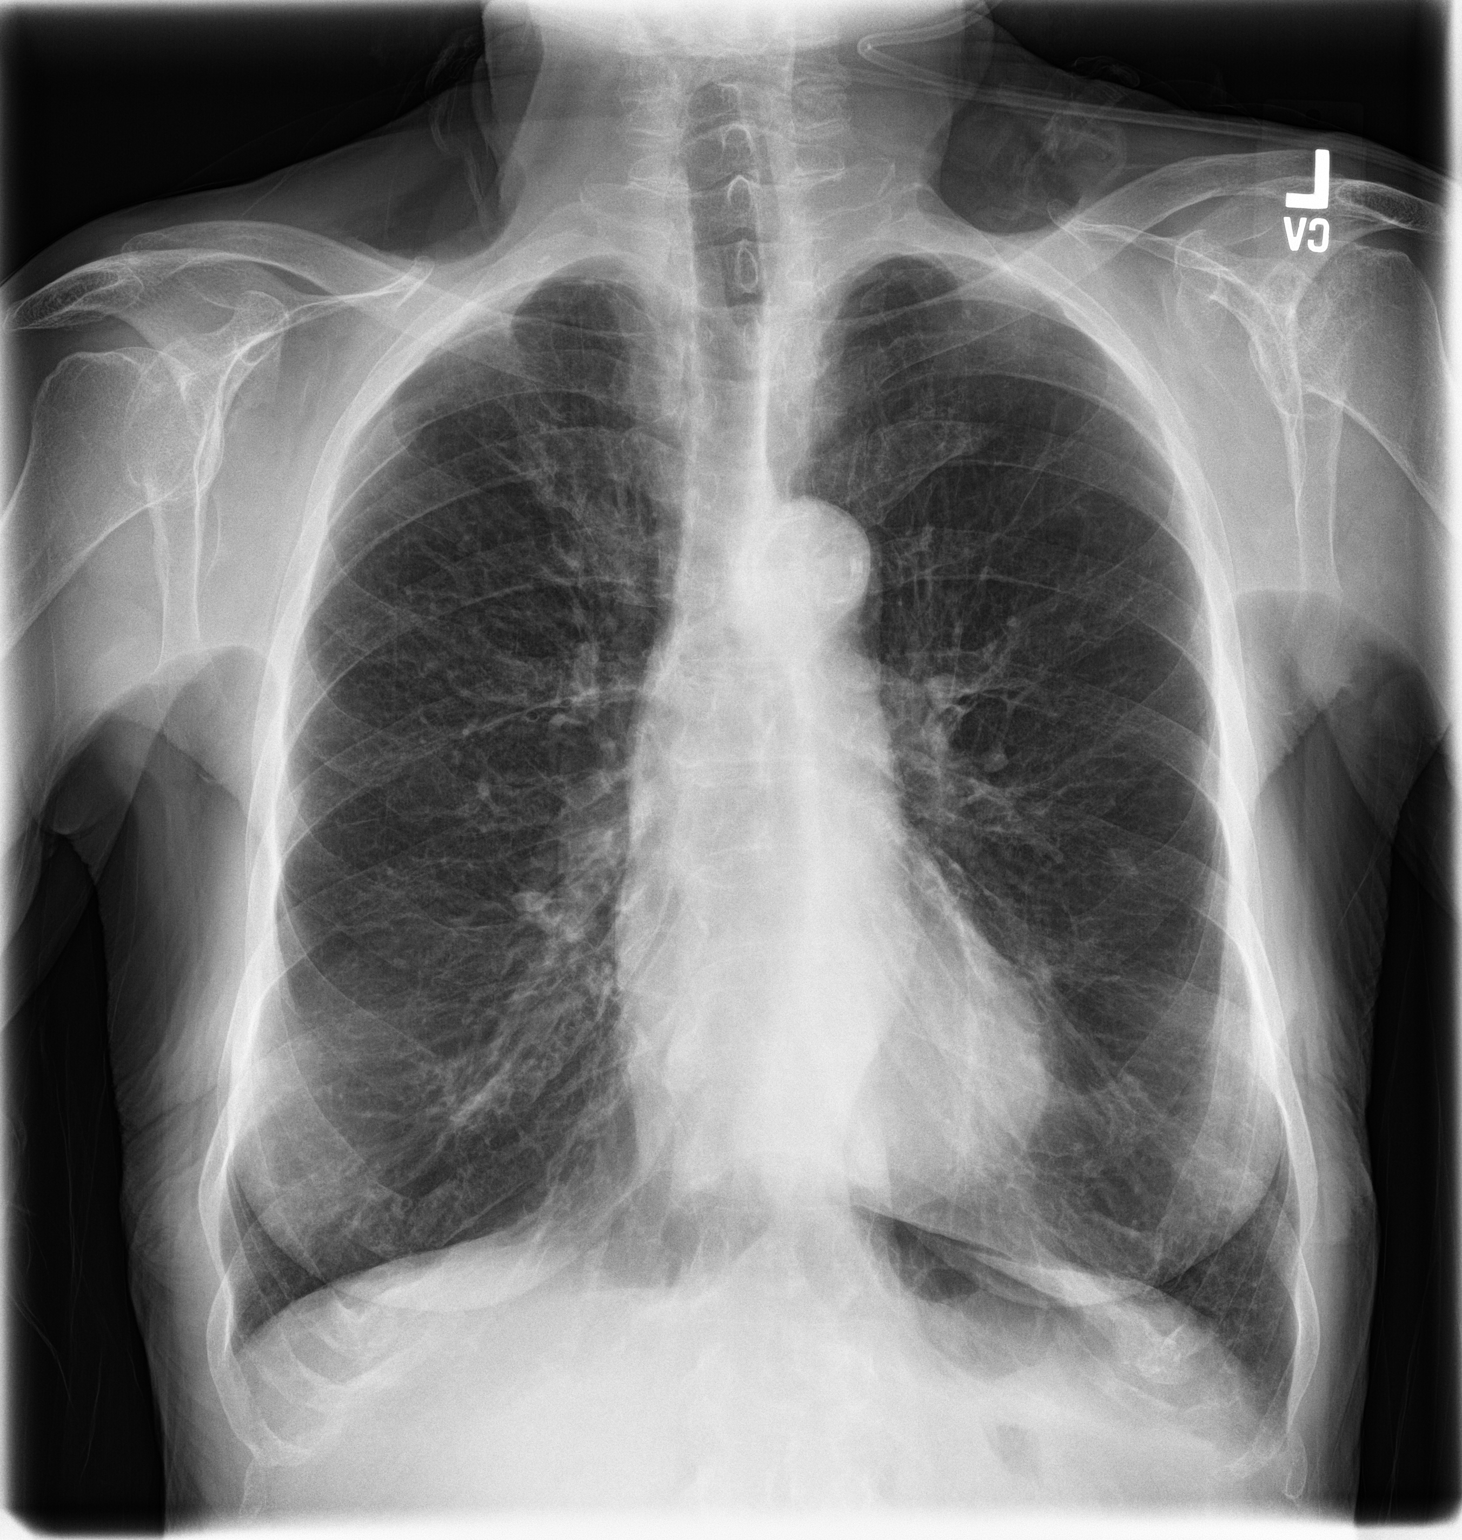

[2 of 2 positions shown; findings below may reference images not displayed]

FINDINGS: Marked pulmonary hyperinflation with chronic interstitial coarsening
and apical emphysematous changes. No superimposed edema or
pneumonia. No effusion or pneumothorax. Ovoid nodular density
overlapping the left mid chest measuring 10 mm. No sclerotic focus
seen in the overlapping posterior eighth rib on preceding CT in
4107. Normal heart size. Mild aortic tortuosity.
IMPRESSION: 1. Emphysema without acute superimposed disease.
2. Possible 1 cm nodule in the left lung. Noncontrast CT could
further evaluate.
